# Patient Record
Sex: Male | Born: 1945 | Race: White | Hispanic: No | Marital: Married | Smoking: Former smoker
Health system: Southern US, Community
[De-identification: ages and names within clinical notes are randomized; demographics above are authoritative.]

## PROBLEM LIST (undated history)

## (undated) DIAGNOSIS — R079 Chest pain, unspecified: Secondary | ICD-10-CM

## (undated) DIAGNOSIS — K219 Gastro-esophageal reflux disease without esophagitis: Secondary | ICD-10-CM

## (undated) DIAGNOSIS — I10 Essential (primary) hypertension: Secondary | ICD-10-CM

## (undated) HISTORY — PX: KNEE SURGERY: SHX244

## (undated) HISTORY — PX: VEIN SURGERY: SHX48

## (undated) HISTORY — DX: Chest pain, unspecified: R07.9

---

## 2015-05-31 ENCOUNTER — Other Ambulatory Visit (HOSPITAL_COMMUNITY): Payer: Self-pay | Admitting: Family Medicine

## 2015-05-31 DIAGNOSIS — R911 Solitary pulmonary nodule: Secondary | ICD-10-CM

## 2015-06-13 ENCOUNTER — Ambulatory Visit (HOSPITAL_COMMUNITY)
Admission: RE | Admit: 2015-06-13 | Discharge: 2015-06-13 | Disposition: A | Discharge status: Home / Self Care | Payer: BC Managed Care – PPO | Source: Ambulatory Visit | Attending: Family Medicine | Admitting: Family Medicine

## 2015-06-13 DIAGNOSIS — R911 Solitary pulmonary nodule: Secondary | ICD-10-CM | POA: Insufficient documentation

## 2015-06-13 LAB — GLUCOSE, CAPILLARY: GLUCOSE-CAPILLARY: 98 mg/dL (ref 65–99)

## 2015-06-13 MED ORDER — FLUDEOXYGLUCOSE F - 18 (FDG) INJECTION
11.2000 | Freq: Once | INTRAVENOUS | Status: DC | PRN
  Administered 2015-06-13: 11.2 via INTRAVENOUS
  Filled 2015-06-13: qty 11.2

## 2016-04-12 ENCOUNTER — Other Ambulatory Visit (HOSPITAL_COMMUNITY): Payer: Self-pay | Admitting: Family Medicine

## 2016-04-12 DIAGNOSIS — K769 Liver disease, unspecified: Secondary | ICD-10-CM

## 2016-04-12 DIAGNOSIS — R918 Other nonspecific abnormal finding of lung field: Secondary | ICD-10-CM

## 2016-04-18 ENCOUNTER — Ambulatory Visit (HOSPITAL_COMMUNITY)
Admission: RE | Admit: 2016-04-18 | Discharge: 2016-04-18 | Disposition: A | Discharge status: Home / Self Care | Payer: Medicare Other | Source: Ambulatory Visit | Attending: Family Medicine | Admitting: Family Medicine

## 2016-04-18 ENCOUNTER — Encounter (HOSPITAL_COMMUNITY): Payer: Self-pay

## 2016-04-18 DIAGNOSIS — K769 Liver disease, unspecified: Secondary | ICD-10-CM

## 2016-04-18 DIAGNOSIS — R918 Other nonspecific abnormal finding of lung field: Secondary | ICD-10-CM

## 2016-04-18 DIAGNOSIS — K7689 Other specified diseases of liver: Secondary | ICD-10-CM | POA: Diagnosis present

## 2016-04-18 HISTORY — DX: Essential (primary) hypertension: I10

## 2016-04-18 MED ORDER — IOPAMIDOL (ISOVUE-300) INJECTION 61%
100.0000 mL | Freq: Once | INTRAVENOUS | Status: AC | PRN
  Administered 2016-04-18: 100 mL via INTRAVENOUS

## 2016-04-22 ENCOUNTER — Other Ambulatory Visit (HOSPITAL_COMMUNITY): Payer: Self-pay | Admitting: Family Medicine

## 2016-04-22 DIAGNOSIS — K7689 Other specified diseases of liver: Secondary | ICD-10-CM

## 2016-05-16 ENCOUNTER — Ambulatory Visit (HOSPITAL_COMMUNITY): Payer: Medicare Other

## 2016-06-21 ENCOUNTER — Other Ambulatory Visit (HOSPITAL_COMMUNITY): Payer: Self-pay | Admitting: Family Medicine

## 2016-06-21 DIAGNOSIS — K769 Liver disease, unspecified: Secondary | ICD-10-CM

## 2016-07-02 ENCOUNTER — Ambulatory Visit (HOSPITAL_COMMUNITY)
Admission: RE | Admit: 2016-07-02 | Discharge: 2016-07-02 | Disposition: A | Discharge status: Home / Self Care | Payer: Medicare Other | Source: Ambulatory Visit | Attending: Family Medicine | Admitting: Family Medicine

## 2016-07-02 DIAGNOSIS — I700 Atherosclerosis of aorta: Secondary | ICD-10-CM | POA: Insufficient documentation

## 2016-07-02 DIAGNOSIS — K7689 Other specified diseases of liver: Secondary | ICD-10-CM | POA: Diagnosis not present

## 2016-07-02 DIAGNOSIS — K769 Liver disease, unspecified: Secondary | ICD-10-CM | POA: Diagnosis present

## 2016-07-02 DIAGNOSIS — K760 Fatty (change of) liver, not elsewhere classified: Secondary | ICD-10-CM | POA: Insufficient documentation

## 2016-07-02 LAB — POCT I-STAT CREATININE: Creatinine, Ser: 0.9 mg/dL (ref 0.61–1.24)

## 2016-07-02 MED ORDER — GADOXETATE DISODIUM 0.25 MMOL/ML IV SOLN
10.0000 mL | Freq: Once | INTRAVENOUS | Status: AC | PRN
  Administered 2016-07-02: 9 mL via INTRAVENOUS

## 2018-05-05 ENCOUNTER — Encounter: Payer: Self-pay | Admitting: Cardiovascular Disease

## 2018-07-02 ENCOUNTER — Encounter: Payer: Self-pay | Admitting: Cardiovascular Disease

## 2018-07-22 ENCOUNTER — Telehealth: Payer: Self-pay | Admitting: Cardiovascular Disease

## 2018-07-22 NOTE — Telephone Encounter (Signed)
Received records from [place]. [facility] on 07/22/18, Appt 07/31/18 @ 8:15AM.NV

## 2018-07-24 DIAGNOSIS — R079 Chest pain, unspecified: Secondary | ICD-10-CM | POA: Insufficient documentation

## 2018-07-24 DIAGNOSIS — I10 Essential (primary) hypertension: Secondary | ICD-10-CM | POA: Insufficient documentation

## 2018-07-31 ENCOUNTER — Encounter: Payer: Self-pay | Admitting: Cardiovascular Disease

## 2018-07-31 ENCOUNTER — Ambulatory Visit: Payer: Medicare Other | Admitting: Cardiovascular Disease

## 2018-07-31 VITALS — BP 164/100 | HR 134 | Ht 73.0 in | Wt 208.2 lb

## 2018-07-31 DIAGNOSIS — I480 Paroxysmal atrial fibrillation: Secondary | ICD-10-CM | POA: Diagnosis not present

## 2018-07-31 DIAGNOSIS — I10 Essential (primary) hypertension: Secondary | ICD-10-CM | POA: Diagnosis not present

## 2018-07-31 DIAGNOSIS — I200 Unstable angina: Secondary | ICD-10-CM | POA: Diagnosis not present

## 2018-07-31 DIAGNOSIS — R0602 Shortness of breath: Secondary | ICD-10-CM | POA: Diagnosis not present

## 2018-07-31 MED ORDER — METOPROLOL SUCCINATE ER 25 MG PO TB24: 25.0000 mg | ORAL_TABLET | Freq: Every day | ORAL | 3 refills | Status: DC

## 2018-07-31 NOTE — Assessment & Plan Note (Signed)
Mr Garske is self-referred because of several month history of chest pain that occurs daily or several times a week.  His only risk factor is hypertension.  The pain lasts for minutes at a time is characterized by a pressure sensation.  Does not radiate.  Also feels weak and has no energy.  I am going to order 2D echo and a pharmacologic Myoview stress test to further evaluate.

## 2018-07-31 NOTE — Assessment & Plan Note (Signed)
History of essential hypertension on quinapril blood pressure measured today at 164/100.  I am going to begin him on Toprol-XL 25 in addition

## 2018-07-31 NOTE — Assessment & Plan Note (Signed)
Mr. Edward Vaughn was in A. fib this morning with a ventricular response of 134 and then converted spontaneously to sinus rhythm with a ventricular response of 73. This patients CHA2DS2-VASc Score and unadjusted Ischemic Stroke Rate (% per year) is equal to 2.2 % stroke rate/year from a score of 2.  I am going to add low-dose beta-blocker and begin him on oral anticoagulation.  Above score calculated as 1 point each if present [CHF, HTN, DM, Vascular=MI/PAD/Aortic Plaque, Age if 65-74, or Male] Above score calculated as 2 points each if present [Age > 75, or Stroke/TIA/TE]

## 2018-07-31 NOTE — Addendum Note (Signed)
Addended by: Theressa Stamps on: 07/31/2018 12:36 PM   Modules accepted: Orders

## 2018-07-31 NOTE — Progress Notes (Signed)
07/31/2018 Edward Vaughn   04/07/1946  [id number]  Primary Physician Park, Nettie Elm, MD Primary Cardiologist: Runell Gess MD Nicholes Calamity, [place]  HPI:  Edward Vaughn is a 72 y.o. mildly overweight married Caucasian male father of 3 biologic children and 3 stepchildren, grandfather of over 20 grandchildren who is self-referred to our practice because his daughter is seen here as well because of new onset chest pain.  His primary care provider is Dr. Meredith Mody in [place].  His only risk factor is treated hypertension.  He is never had a heart attack or stroke.  There is no family history of heart disease.  He smoked remotely.  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  He lives near [place].  He has had new onset chest pain over last several months now occurring several times a week lasting minutes at a time arthritis of chest fullness without radiation.  He does feel weak and dizzy as well.  His EKG this morning in our office was A. fib with a ventricular response of 134 although he spontaneously converted to sinus rhythm during his visit.   Current Meds  Medication Sig  . omeprazole (PRILOSEC) 20 MG capsule Take 1 capsule by mouth daily.  . quinapril (ACCUPRIL) 20 MG tablet Take 1 tablet by mouth daily.     No Known Allergies  Social History   Socioeconomic History  . Marital status: Married    Spouse name: Not on file  . Number of children: Not on file  . Years of education: Not on file  . Highest education level: Not on file  Occupational History  . Not on file  Social Needs  . Financial resource strain: Not on file  . Food insecurity:    Worry: Not on file    Inability: Not on file  . Transportation needs:    Medical: Not on file    Non-medical: Not on file  Tobacco Use  . Smoking status: Never Smoker  . Smokeless tobacco: Never Used  Substance and Sexual Activity  . Alcohol use: Not on file  . Drug use: Not on file  .  Sexual activity: Not on file  Lifestyle  . Physical activity:    Days per week: Not on file    Minutes per session: Not on file  . Stress: Not on file  Relationships  . Social connections:    Talks on phone: Not on file    Gets together: Not on file    Attends religious service: Not on file    Active member of club or organization: Not on file    Attends meetings of clubs or organizations: Not on file    Relationship status: Not on file  . Intimate partner violence:    Fear of current or ex partner: Not on file    Emotionally abused: Not on file    Physically abused: Not on file    Forced sexual activity: Not on file  Other Topics Concern  . Not on file  Social History Narrative  . Not on file     Review of Systems: General: negative for chills, fever, night sweats or weight changes.  Cardiovascular: negative for chest pain, dyspnea on exertion, edema, orthopnea, palpitations, paroxysmal nocturnal dyspnea or shortness of breath Dermatological: negative for rash Respiratory: negative for cough or wheezing Urologic: negative for hematuria Abdominal: negative for nausea, vomiting, diarrhea, bright red blood per rectum, melena, or hematemesis  Neurologic: negative for visual changes, syncope, or dizziness All other systems reviewed and are otherwise negative except as noted above.    Blood pressure (!) 164/100, pulse (!) 134, height 6\' 1"  (1.854 m), weight 208 lb 3.2 oz (94.4 kg).  General appearance: alert and no distress Neck: no adenopathy, no carotid bruit, no JVD, supple, symmetrical, trachea midline and thyroid not enlarged, symmetric, no tenderness/mass/nodules Lungs: clear to auscultation bilaterally Heart: regular rate and rhythm, S1, S2 normal, no murmur, click, rub or gallop Extremities: edema No edema Pulses: 2+ and symmetric Skin: Skin color, texture, turgor normal. No rashes or lesions Neurologic: Alert and oriented X 3, normal strength and tone. Normal symmetric  reflexes. Normal coordination and gait  EKG the initial EKG was A. fib with a ventricular response of 134 which spontaneously converted to sinus rhythm with ventricular response of 73 without ST or T wave changes.  I personally reviewed both EKGs.  ASSESSMENT AND PLAN:   Hypertension History of essential hypertension on quinapril blood pressure measured today at 164/100.  I am going to begin him on Toprol-XL 25 in addition  Paroxysmal atrial fibrillation [facility]) Mr. Judi Saa was in A. fib this morning with a ventricular response of 134 and then converted spontaneously to sinus rhythm with a ventricular response of 73. This patients CHA2DS2-VASc Score and unadjusted Ischemic Stroke Rate (% per year) is equal to 2.2 % stroke rate/year from a score of 2.  I am going to add low-dose beta-blocker and begin him on oral anticoagulation.  Above score calculated as 1 point each if present [CHF, HTN, DM, Vascular=MI/PAD/Aortic Plaque, Age if 65-74, or Male] Above score calculated as 2 points each if present [Age > 75, or Stroke/TIA/TE]   Chest pain Mr Vaughn is self-referred because of several month history of chest pain that occurs daily or several times a week.  His only risk factor is hypertension.  The pain lasts for minutes at a time is characterized by a pressure sensation.  Does not radiate.  Also feels weak and has no energy.  I am going to order 2D echo and a pharmacologic Myoview stress test to further evaluate.      Runell Gess MD FACP,FACC,FAHA, [facility] 07/31/2018 8:43 AM

## 2018-07-31 NOTE — Patient Instructions (Signed)
Medication Instructions:  Your physician has recommended you make the following change in your medication:  1) START Toprol XL 25 mg tablet by mouth ONCE daily  If you need a refill on your cardiac medications before your next appointment, please call your pharmacy.   Lab work: Your physician recommends that you return for lab work in: TODAY   If you have labs (blood work) drawn today and your tests are completely normal, you will receive your results only by: Marland Kitchen MyChart Message (if you have MyChart) OR . A paper copy in the mail If you have any lab test that is abnormal or we need to change your treatment, we will call you to review the results.  Testing/Procedures: Your physician has requested that you have an echocardiogram. Echocardiography is a painless test that uses sound waves to create images of your heart. It provides your doctor with information about the size and shape of your heart and how well your heart's chambers and valves are working. This procedure takes approximately one hour. There are no restrictions for this procedure.  Your physician has requested that you have a lexiscan myoview. For further information please visit [email or web address] Please follow instruction sheet, as given.    Follow-Up: At [facility], you and your health needs are our priority.  As part of our continuing mission to provide you with exceptional heart care, we have created designated Provider Care Teams.  These Care Teams include your primary Cardiologist (physician) and Advanced Practice Providers (APPs -  Physician Assistants and Nurse Practitioners) who all work together to provide you with the care you need, when you need it. You will need a follow up appointment in 3 weeks.  Please call our office 2 months in advance to schedule this appointment.  You may see Dr. Allyson Sabal or one of the following Advanced Practice Providers on your designated Care Team:   Corine Shelter, PA-C Judy Pimple,  [place] . Marjie Skiff, PA-C  Any Other Special Instructions Will Be Listed Below (If Applicable).

## 2018-08-04 ENCOUNTER — Telehealth (HOSPITAL_COMMUNITY): Payer: Self-pay

## 2018-08-04 NOTE — Telephone Encounter (Signed)
Encounter complete. 

## 2018-08-05 ENCOUNTER — Ambulatory Visit (HOSPITAL_COMMUNITY): Payer: Medicare Other | Attending: Cardiology

## 2018-08-05 ENCOUNTER — Other Ambulatory Visit: Payer: Medicare Other

## 2018-08-05 ENCOUNTER — Telehealth (HOSPITAL_COMMUNITY): Payer: Self-pay | Admitting: *Deleted

## 2018-08-05 ENCOUNTER — Other Ambulatory Visit: Payer: Self-pay

## 2018-08-05 DIAGNOSIS — I10 Essential (primary) hypertension: Secondary | ICD-10-CM

## 2018-08-05 DIAGNOSIS — I480 Paroxysmal atrial fibrillation: Secondary | ICD-10-CM | POA: Diagnosis not present

## 2018-08-05 DIAGNOSIS — R0602 Shortness of breath: Secondary | ICD-10-CM

## 2018-08-05 DIAGNOSIS — I200 Unstable angina: Secondary | ICD-10-CM | POA: Diagnosis present

## 2018-08-05 LAB — BASIC METABOLIC PANEL
BUN/Creatinine Ratio: 14 (ref 10–24)
BUN: 14 mg/dL (ref 8–27)
CALCIUM: 9.1 mg/dL (ref 8.6–10.2)
CHLORIDE: 103 mmol/L (ref 96–106)
CO2: 29 mmol/L (ref 20–29)
Creatinine, Ser: 0.98 mg/dL (ref 0.76–1.27)
GFR, EST AFRICAN AMERICAN: 88 mL/min/{1.73_m2} (ref >59)
GFR, EST NON AFRICAN AMERICAN: 76 mL/min/{1.73_m2} (ref >59)
Glucose: 102 mg/dL — ABNORMAL HIGH (ref 65–99)
POTASSIUM: 4.8 mmol/L (ref 3.5–5.2)
SODIUM: 139 mmol/L (ref 134–144)

## 2018-08-05 NOTE — Telephone Encounter (Signed)
Patient given detailed instructions per Myocardial Perfusion Study Information Sheet for the test on 08/06/18 at 0730. Patient notified to arrive 15 minutes early and that it is imperative to arrive on time for appointment to keep from having the test rescheduled.  If you need to cancel or reschedule your appointment, please call the office within 24 hours of your appointment. . Patient verbalized understanding.Jamayah Myszka, Adelene Idler

## 2018-08-05 NOTE — Telephone Encounter (Signed)
Left message on voicemail in reference to upcoming appointment scheduled for 08/06/18. Phone number given for a call back so details instructions can be given. Eulogia Dismore, Adelene Idler

## 2018-08-06 ENCOUNTER — Ambulatory Visit (HOSPITAL_COMMUNITY)
Admission: RE | Admit: 2018-08-06 | Discharge: 2018-08-06 | Disposition: A | Discharge status: Home / Self Care | Payer: Medicare Other | Source: Ambulatory Visit | Attending: Cardiology | Admitting: Cardiology

## 2018-08-06 ENCOUNTER — Telehealth: Payer: Self-pay | Admitting: *Deleted

## 2018-08-06 DIAGNOSIS — R0602 Shortness of breath: Secondary | ICD-10-CM | POA: Insufficient documentation

## 2018-08-06 DIAGNOSIS — I10 Essential (primary) hypertension: Secondary | ICD-10-CM | POA: Insufficient documentation

## 2018-08-06 DIAGNOSIS — I200 Unstable angina: Secondary | ICD-10-CM | POA: Insufficient documentation

## 2018-08-06 DIAGNOSIS — I480 Paroxysmal atrial fibrillation: Secondary | ICD-10-CM

## 2018-08-06 LAB — MYOCARDIAL PERFUSION IMAGING
CHL CUP RESTING HR STRESS: 61 {beats}/min
LVDIAVOL: 84 mL (ref 62–150)
LVSYSVOL: 36 mL
Peak HR: 98 {beats}/min
SDS: 0
SRS: 0
SSS: 0

## 2018-08-06 MED ORDER — APIXABAN 5 MG PO TABS: 5.0000 mg | ORAL_TABLET | Freq: Two times a day (BID) | ORAL | 1 refills | Status: DC

## 2018-08-06 MED ORDER — TECHNETIUM TC 99M TETROFOSMIN IV KIT
10.1000 | PACK | Freq: Once | INTRAVENOUS | Status: AC | PRN
  Administered 2018-08-06: 10.1 via INTRAVENOUS
  Filled 2018-08-06: qty 11

## 2018-08-06 MED ORDER — TECHNETIUM TC 99M TETROFOSMIN IV KIT
32.0000 | PACK | Freq: Once | INTRAVENOUS | Status: AC | PRN
  Administered 2018-08-06: 32 via INTRAVENOUS
  Filled 2018-08-06: qty 32

## 2018-08-06 MED ORDER — REGADENOSON 0.4 MG/5ML IV SOLN
0.4000 mg | Freq: Once | INTRAVENOUS | Status: AC
  Administered 2018-08-06: 0.4 mg via INTRAVENOUS

## 2018-08-06 NOTE — Telephone Encounter (Signed)
Received patient as a walk in requesting prescription for Eliquis.  He states he has a 30 day free card but his pharmacy needs a prescription.   Spoke to pharmacist Belenda Cruise- ok for patient to start Eliquis 5 mg BID based on updated lab work.    2 weeks of samples provided, patient has 30 day voucher, rx sent to pharmacy.

## 2018-08-07 NOTE — Telephone Encounter (Signed)
Complete

## 2018-08-21 ENCOUNTER — Encounter: Payer: Self-pay | Admitting: Cardiovascular Disease

## 2018-08-21 ENCOUNTER — Ambulatory Visit: Payer: Medicare Other | Admitting: Cardiovascular Disease

## 2018-08-21 DIAGNOSIS — I200 Unstable angina: Secondary | ICD-10-CM

## 2018-08-21 DIAGNOSIS — I10 Essential (primary) hypertension: Secondary | ICD-10-CM | POA: Diagnosis not present

## 2018-08-21 DIAGNOSIS — I480 Paroxysmal atrial fibrillation: Secondary | ICD-10-CM | POA: Diagnosis not present

## 2018-08-21 MED ORDER — QUINAPRIL HCL 10 MG PO TABS: 10.0000 mg | ORAL_TABLET | Freq: Every day | ORAL | 3 refills | Status: DC

## 2018-08-21 NOTE — Progress Notes (Signed)
08/21/2018 [name]   [birth year]  [id number]  Primary Physician Park, Nettie Elm, MD Primary Cardiologist: Runell Gess MD Nicholes Calamity, [place]  HPI:  Edward Vaughn is a 72 y.o.  mildly overweight married Caucasian male father of 3 biologic children and 3 stepchildren, grandfather of over 20 grandchildren who is self-referred to our practice because his daughter is seen here as well because of new onset chest pain.  His primary care provider is Dr. Meredith Mody in [place].    I last saw him in the office 07/31/2018.  His only risk factor is treated hypertension.  He is never had a heart attack or stroke.  There is no family history of heart disease.  He smoked remotely.  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  He lives near [place].  He has had new onset chest pain over last several months now occurring several times a week lasting minutes at a time arthritis of chest fullness without radiation.  He does feel weak and dizzy as well.  His EKG this morning in our office was A. fib with a ventricular response of 134 although he spontaneously converted to sinus rhythm during his visit.  Since I saw him last he did have a Myoview stress test performed 08/06/2018 which was low risk and nonischemic and 2D echo that showed normal LV systolic function.  He feels symptomatically improved and is had no recurrent A. fib.  He is relatively hypotensive am going to down titrate his quinapril.   Current Meds  Medication Sig  . apixaban (ELIQUIS) 5 MG TABS tablet Take 1 tablet (5 mg total) by mouth 2 (two) times daily.  . metoprolol succinate (TOPROL-XL) 25 MG 24 hr tablet Take 1 tablet (25 mg total) by mouth daily. Take with or immediately following a meal.  . omeprazole (PRILOSEC) 20 MG capsule Take 1 capsule by mouth daily.  . quinapril (ACCUPRIL) 20 MG tablet Take 1 tablet by mouth daily.     No Known Allergies  Social History   Socioeconomic History    . Marital status: Married    Spouse name: Not on file  . Number of children: Not on file  . Years of education: Not on file  . Highest education level: Not on file  Occupational History  . Not on file  Social Needs  . Financial resource strain: Not on file  . Food insecurity:    Worry: Not on file    Inability: Not on file  . Transportation needs:    Medical: Not on file    Non-medical: Not on file  Tobacco Use  . Smoking status: Never Smoker  . Smokeless tobacco: Never Used  Substance and Sexual Activity  . Alcohol use: Not on file  . Drug use: Not on file  . Sexual activity: Not on file  Lifestyle  . Physical activity:    Days per week: Not on file    Minutes per session: Not on file  . Stress: Not on file  Relationships  . Social connections:    Talks on phone: Not on file    Gets together: Not on file    Attends religious service: Not on file    Active member of club or organization: Not on file    Attends meetings of clubs or organizations: Not on file    Relationship status: Not on file  . Intimate partner violence:    Fear of current  or ex partner: Not on file    Emotionally abused: Not on file    Physically abused: Not on file    Forced sexual activity: Not on file  Other Topics Concern  . Not on file  Social History Narrative  . Not on file     Review of Systems: General: negative for chills, fever, night sweats or weight changes.  Cardiovascular: negative for chest pain, dyspnea on exertion, edema, orthopnea, palpitations, paroxysmal nocturnal dyspnea or shortness of breath Dermatological: negative for rash Respiratory: negative for cough or wheezing Urologic: negative for hematuria Abdominal: negative for nausea, vomiting, diarrhea, bright red blood per rectum, melena, or hematemesis Neurologic: negative for visual changes, syncope, or dizziness All other systems reviewed and are otherwise negative except as noted above.    Blood pressure 112/66,  pulse 72, height 6\' 1"  (1.854 m), weight 207 lb (93.9 kg).  General appearance: alert and no distress Neck: no adenopathy, no carotid bruit, no JVD, supple, symmetrical, trachea midline and thyroid not enlarged, symmetric, no tenderness/mass/nodules Lungs: clear to auscultation bilaterally Heart: regular rate and rhythm, S1, S2 normal, no murmur, click, rub or gallop Extremities: extremities normal, atraumatic, no cyanosis or edema Pulses: 2+ and symmetric Skin: Skin color, texture, turgor normal. No rashes or lesions Neurologic: Alert and oriented X 3, normal strength and tone. Normal symmetric reflexes. Normal coordination and gait  EKG not performed today  ASSESSMENT AND PLAN:   Hypertension History of essential hypertension blood pressure measured at 112/66.  He is on metoprolol and quinapril.  I am going to back down his quinapril from 20 to 10 mg because of relative hypotension and symptoms.  Chest pain History of chest pain with recent negative Myoview stress test.  Patient is no longer symptomatic.  Paroxysmal atrial fibrillation (HCC) History of paroxysmal atrial fibrillation maintaining sinus rhythm on beta-blocker and Eliquis oral anticoagulation.      Runell GessJonathan J. Prezley Qadir MD FACP,FACC,FAHA, [facility] 08/21/2018 9:55 AM

## 2018-08-21 NOTE — Addendum Note (Signed)
Addended by: Regis BillPRATT, Callen Vancuren B on: 08/21/2018 10:04 AM   Modules accepted: Orders

## 2018-08-21 NOTE — Assessment & Plan Note (Signed)
History of chest pain with recent negative Myoview stress test.  Patient is no longer symptomatic.

## 2018-08-21 NOTE — Assessment & Plan Note (Signed)
History of paroxysmal atrial fibrillation maintaining sinus rhythm on beta-blocker and Eliquis oral anticoagulation.

## 2018-08-21 NOTE — Assessment & Plan Note (Signed)
History of essential hypertension blood pressure measured at 112/66.  He is on metoprolol and quinapril.  I am going to back down his quinapril from 20 to 10 mg because of relative hypotension and symptoms.

## 2018-08-21 NOTE — Patient Instructions (Addendum)
Medication Instructions:  DECREASE YOUR QUINAPRIL TO 10 MG DAILY  If you need a refill on your cardiac medications before your next appointment, please call your pharmacy.   Lab work: REQUESTED COPY OF LABS FROM DR PARK   Testing/Procedures: NONE  Follow-Up: At [facility], you and your health needs are our priority.  As part of our continuing mission to provide you with exceptional heart care, we have created designated Provider Care Teams.  These Care Teams include your primary Cardiologist (physician) and Advanced Practice Providers (APPs -  Physician Assistants and Nurse Practitioners) who all work together to provide you with the care you need, when you need it. You will need a follow up appointment in 6 months WITH PA/NP.  Please call our office 2 months in advance to schedule this appointment.   Your physician recommends that you schedule a follow-up appointment in: 12 MONTHS WITH DR Allyson SabalBERRY. CALL THE OFFICE 2-3 MONTHS IN ADVANCE

## 2019-02-16 ENCOUNTER — Telehealth: Payer: Self-pay

## 2019-02-16 NOTE — Telephone Encounter (Signed)
Left message on both numbers in chart requesting patient contact office for verbal consent for virtual visit.

## 2019-02-18 ENCOUNTER — Other Ambulatory Visit: Payer: Self-pay | Admitting: Cardiovascular Disease

## 2019-02-18 ENCOUNTER — Other Ambulatory Visit: Payer: Self-pay

## 2019-02-18 ENCOUNTER — Telehealth: Payer: Self-pay | Admitting: Cardiology

## 2019-02-18 NOTE — Progress Notes (Signed)
Opened in error

## 2019-02-18 NOTE — Telephone Encounter (Signed)
smartphone/verbal consent/ my chart via emailed/ pre reg completed °

## 2019-02-23 ENCOUNTER — Telehealth (INDEPENDENT_AMBULATORY_CARE_PROVIDER_SITE_OTHER): Payer: Medicare Other | Admitting: General Practice

## 2019-02-23 ENCOUNTER — Encounter: Payer: Self-pay | Admitting: General Practice

## 2019-02-23 ENCOUNTER — Telehealth: Payer: Self-pay

## 2019-02-23 VITALS — Ht 73.0 in | Wt 210.0 lb

## 2019-02-23 DIAGNOSIS — I10 Essential (primary) hypertension: Secondary | ICD-10-CM | POA: Diagnosis not present

## 2019-02-23 NOTE — Telephone Encounter (Signed)
Verbal consent given to Lowella Bandy on 02/18/2019 at 3:19pm      Virtual Visit Pre-Appointment Phone Call  "Edward Vaughn, I am calling you today to discuss your upcoming appointment. We are currently trying to limit exposure to the virus that causes COVID-19 by seeing patients at home rather than in the office."  1. "What is the BEST phone number to call the day of the visit?" - include this in appointment notes  2. "Do you have or have access to (through a family member/friend) a smartphone with video capability that we can use for your visit?" a. If yes - list this number in appt notes as "cell" (if different from BEST phone #) and list the appointment type as a VIDEO visit in appointment notes b. If no - list the appointment type as a PHONE visit in appointment notes  3. Confirm consent - "In the setting of the current Covid19 crisis, you are scheduled for a VIDEO visit with your provider on 02/23/2019 at 9:00AM.  Just as we do with many in-office visits, in order for you to participate in this visit, we must obtain consent.  If you'd like, I can send this to your mychart (if signed up) or email for you to review.  Otherwise, I can obtain your verbal consent now.  All virtual visits are billed to your insurance company just like a normal visit would be.  By agreeing to a virtual visit, we'd like you to understand that the technology does not allow for your provider to perform an examination, and thus may limit your provider's ability to fully assess your condition. If your provider identifies any concerns that need to be evaluated in person, we will make arrangements to do so.  Finally, though the technology is pretty good, we cannot assure that it will always work on either your or our end, and in the setting of a video visit, we may have to convert it to a phone-only visit.  In either situation, we cannot ensure that we have a secure connection.  Are you willing to proceed?" STAFF: Did the patient  verbally acknowledge consent to telehealth visit? Document YES/NO here: YES  4. Advise patient to be prepared - "Two hours prior to your appointment, go ahead and check your blood pressure, pulse, oxygen saturation, and your weight (if you have the equipment to check those) and write them all down. When your visit starts, your provider will ask you for this information. If you have an Apple Watch or Kardia device, please plan to have heart rate information ready on the day of your appointment. Please have a pen and paper handy nearby the day of the visit as well."  5. Give patient instructions for MyChart download to smartphone OR Doximity/Doxy.me as below if video visit (depending on what platform provider is using)  6. Inform patient they will receive a phone call 15 minutes prior to their appointment time (may be from unknown caller ID) so they should be prepared to answer    TELEPHONE CALL NOTE  Edward Vaughn has been deemed a candidate for a follow-up tele-health visit to limit community exposure during the Covid-19 pandemic. I spoke with the patient via phone to ensure availability of phone/video source, confirm preferred email & phone number, and discuss instructions and expectations.  I reminded Edward Vaughn to be prepared with any vital sign and/or heart rhythm information that could potentially be obtained via home monitoring, at the time of his visit. I reminded  Edward Vaughn to expect a phone call prior to his visit.  Edward Vaughn, Edward Vaughn, CMA 02/23/2019 4:18 PM   INSTRUCTIONS FOR DOWNLOADING THE MYCHART APP TO SMARTPHONE  - The patient must first make sure to have activated MyChart and know their login information - If Apple, go to [employer] Store and type in MyChart in the search bar and download the app. If Android, ask patient to go to [employer] Play Store and type in [name] in the search bar and download the app. The app is free but as with any other app downloads, their phone may  require them to verify saved payment information or Apple/Android password.  - The patient will need to then log into the app with their MyChart username and password, and select [facility] as their healthcare provider to link the account. When it is time for your visit, go to the MyChart app, find appointments, and click Begin Video Visit. Be sure to Select Allow for your device to access the Microphone and Camera for your visit. You will then be connected, and your provider will be with you shortly.  **If they have any issues connecting, or need assistance please contact MyChart service desk [phone])**  **If using a computer, in order to ensure the best quality for their visit they will need to use either of the following Internet Browsers: [employer] Edge, or Google Chrome**  IF USING DOXIMITY or DOXY.ME - The patient will receive a link just prior to their visit by text.     FULL LENGTH CONSENT FOR TELE-HEALTH VISIT   I hereby voluntarily request, consent and authorize [facility] and its employed or contracted physicians, physician assistants, nurse practitioners or other licensed health care professionals (the Practitioner), to provide me with telemedicine health care services (the "Services") as deemed necessary by the treating Practitioner. I acknowledge and consent to receive the Services by the Practitioner via telemedicine. I understand that the telemedicine visit will involve communicating with the Practitioner through live audiovisual communication technology and the disclosure of certain medical information by electronic transmission. I acknowledge that I have been given the opportunity to request an in-person assessment or other available alternative prior to the telemedicine visit and am voluntarily participating in the telemedicine visit.  I understand that I have the right to withhold or withdraw my consent to the use of telemedicine in the course of my care at  any time, without affecting my right to future care or treatment, and that the Practitioner or I may terminate the telemedicine visit at any time. I understand that I have the right to inspect all information obtained and/or recorded in the course of the telemedicine visit and may receive copies of available information for a reasonable fee.  I understand that some of the potential risks of receiving the Services via telemedicine include:  Marland Kitchen. Delay or interruption in medical evaluation due to technological equipment failure or disruption; . Information transmitted may not be sufficient (e.g. poor resolution of images) to allow for appropriate medical decision making by the Practitioner; and/or  . In rare instances, security protocols could fail, causing a breach of personal health information.  Furthermore, I acknowledge that it is my responsibility to provide information about my medical history, conditions and care that is complete and accurate to the best of my ability. I acknowledge that Practitioner's advice, recommendations, and/or decision may be based on factors not within their control, such as incomplete or inaccurate data provided by me or distortions of diagnostic  images or specimens that may result from electronic transmissions. I understand that the practice of medicine is not an exact science and that Practitioner makes no warranties or guarantees regarding treatment outcomes. I acknowledge that I will receive a copy of this consent concurrently upon execution via email to the email address I last provided but may also request a printed copy by calling the office of [facility].    I understand that my insurance will be billed for this visit.   I have read or had this consent read to me. . I understand the contents of this consent, which adequately explains the benefits and risks of the Services being provided via telemedicine.  . I have been provided ample opportunity to ask questions  regarding this consent and the Services and have had my questions answered to my satisfaction. . I give my informed consent for the services to be provided through the use of telemedicine in my medical care  By participating in this telemedicine visit I agree to the above.

## 2019-02-23 NOTE — Patient Instructions (Signed)
Medication Instructions:  Your physician recommends that you continue on your current medications as directed. Please refer to the Current Medication list given to you today.  If you need a refill on your cardiac medications before your next appointment, please call your pharmacy.   Lab work: None  If you have labs (blood work) drawn today and your tests are completely normal, you will receive your results only by: . MyChart Message (if you have MyChart) OR . A paper copy in the mail If you have any lab test that is abnormal or we need to change your treatment, we will call you to review the results.  Testing/Procedures: None   Follow-Up: At [facility], you and your health needs are our priority.  As part of our continuing mission to provide you with exceptional heart care, we have created designated Provider Care Teams.  These Care Teams include your primary Cardiologist (physician) and Advanced Practice Providers (APPs -  Physician Assistants and Nurse Practitioners) who all work together to provide you with the care you need, when you need it. You will need a follow up appointment in 6 months.  Please call our office 2 months in advance to schedule this appointment.  You may see Jonathan Berry, MD or one of the following Advanced Practice Providers on your designated Care Team:   Luke Kilroy, PA-C Krista Kroeger, PA-C . Callie Goodrich, PA-C  Any Other Special Instructions Will Be Listed Below (If Applicable).   

## 2019-02-23 NOTE — Progress Notes (Deleted)
{  Choose 1 Note Type (Telehealth Visit or Telephone Visit):[phone]} Evaluation Performed:  Follow-up visit  This visit type was conducted due to national recommendations for restrictions regarding the COVID-19 Pandemic (e.g. social distancing).  This format is felt to be most appropriate for this patient at this time.  All issues noted in this document were discussed and addressed.  No physical exam was performed (except for noted visual exam findings with Video Visits).  Please refer to the patient's chart (MyChart message for video visits and phone note for telephone visits) for the patient's consent to telehealth for [facility]. _____________   Date:  02/23/2019   Patient ID:  Edward Vaughn, DOB [birth year], MRN [id number] Patient Location:  Primary residence  Provider location:   [facility]   Primary Care Provider:  Ardyth Man, MD Primary Cardiologist:  Nanetta Batty, MD  Chief Complaint    ***  Past Medical History    Past Medical History:  Diagnosis Date  . Chest pain   . Hypertension    No past surgical history on file.  Allergies  No Known Allergies  History of Present Illness    Edward Vaughn is a 73 y.o. male who presents via audio/video conferencing for a telehealth visit today.  ***  The patient {does/does not:200015} have symptoms concerning for COVID-19 infection (fever, chills, cough, or new shortness of breath).   Home Medications    Prior to Admission medications   Medication Sig Start Date End Date Taking? Authorizing Provider  ELIQUIS 5 MG TABS tablet TAKE ONE TABLET BY MOUTH TWICE DAILY 02/19/19   Runell Gess, MD  metoprolol succinate (TOPROL-XL) 25 MG 24 hr tablet Take 1 tablet (25 mg total) by mouth daily. Take with or immediately following a meal. 07/31/18 10/29/18  Runell Gess, MD  omeprazole (PRILOSEC) 20 MG capsule Take 1 capsule by mouth daily. 05/05/18   [provider]  quinapril (ACCUPRIL) 10 MG tablet  Take 1 tablet (10 mg total) by mouth daily. 08/21/18   Runell Gess, MD    Review of Systems    ***.  All other systems reviewed and are otherwise negative except as noted above.  Physical Exam    Vital Signs:  There were no vitals taken for this visit.   Well nourished, well developed male in no*** acute distress. ***  Accessory Clinical Findings    ECG personally reviewed by me today - *** - no acute changes.  Assessment & Plan    1.  ***  COVID-19 Education: The signs and symptoms of COVID-19 were discussed with the patient and how to seek care for testing (follow up with PCP or arrange E-visit).  ***The importance of social distancing was discussed today.  Patient Risk:   After full review of this patient's history and clinical status, I feel that he is at least moderate risk for cardiac complications at this time, thus necessitating a telehealth visit sooner than our first available in office visit.  Time:   Today, I have spent *** minutes with the patient with telehealth technology discussing ***.    Disposition:   Ronney Asters, NP 02/23/2019, 8:18 AM

## 2019-02-23 NOTE — Progress Notes (Signed)
Telephone Visit   Evaluation Performed:  Follow-up visit  This visit type was conducted due to national recommendations for restrictions regarding the COVID-19 Pandemic (e.g. social distancing).  This format is felt to be most appropriate for this patient at this time.  All issues noted in this document were discussed and addressed.  No physical exam was performed (except for noted visual exam findings with Video Visits).  Please refer to the patient's chart (MyChart message for video visits and phone note for telephone visits) for the patient's consent to telehealth for [facility]  Date:  02/23/2019   ID:  Edward Vaughn, DOB [birth year], MRN [id number]  Patient Location:  [address] [place] [address]   Provider location:   [facility] [address] [place], [place] [address]  PCP:  Ardyth Man, MD  Cardiologist:  Nanetta Batty, MD  Electrophysiologist:  None   Chief Complaint: Paroxysmal atrial fibrillation, Hypertension  History of Present Illness:    Edward Vaughn is a 73 y.o. male who presents via audio/video conferencing for a telehealth visit today.  Patient verified DOB and address.  He was seen 07/31/2018 by Dr. Allyson Sabal for chest pain that had been occurring several times a week and lasting minutes. His pain was nonradiating.  During that visit EKG showed A. fib with ventricular response of 134, patient converted to sinus during visit.  08/05/2018 echocardiogram normal.  Lexiscan 08/06/2018 no ischemia, normal LVEF 57%.  Patient was contacted today for 58-month follow-up.  He states he feels well and has been doing light yard work around his house.  He states he has been less active and gained 10 to 12 pounds due to being less active and staying at home more.  He also endorses dizziness that he has had for the past 3 years and tried medication several different times without relief.  He denies falls.  After last  visit with Dr. Allyson Sabal the patient went to his PCP.  PCP continued quinapril 20 mg daily.  Patient blood pressures stable.  He denies chest pain, shortness of breath, orthopnea space and PND.  He denies palpitations and activity intolerance.  The patient does not  have symptoms concerning for COVID-19 infection (fever, chills, cough, or new SHORTNESS OF BREATH).  Patient endorses wearing mask and keeping social distance when he needs to go out.  Prior CV studies:   The following studies were reviewed today:  Echocardiogram August 05, 2017  - Left ventricle: The cavity size was normal. There was mild focal   basal hypertrophy of the septum. Systolic function was normal.   The estimated ejection fraction was in the range of 55% to 60%.   Wall motion was normal; there were no regional wall motion   abnormalities. Left ventricular diastolic function parameters   were normal. - Aortic valve: Trileaflet; normal thickness, mildly calcified   leaflets. - Mitral valve: There was trivial regurgitation. - Tricuspid valve: There was trivial regurgitation.  Myocardial perfusion imaging: Lexiscan   The left ventricular ejection fraction is normal (55-65%).  Nuclear stress EF: 57%.  There was no ST segment deviation noted during stress.  The study is normal.   1. EF 57%, normal wall motion.  2. No ischemia or infarction on perfusion images.   Normal study.  Past Medical History:  Diagnosis Date  . Chest pain   . Hypertension    No past surgical history on file.   Current Meds  Medication  Sig  . ELIQUIS 5 MG TABS tablet TAKE ONE TABLET BY MOUTH TWICE DAILY  . omeprazole (PRILOSEC) 20 MG capsule Take 1 capsule by mouth daily.  . quinapril (ACCUPRIL) 10 MG tablet Take 1 tablet (10 mg total) by mouth daily.     Allergies:   Patient has no known allergies.   Social History   Tobacco Use  . Smoking status: Never Smoker  . Smokeless tobacco: Never Used  Substance Use Topics  .  Alcohol use: Not on file  . Drug use: Not on file     Family Hx: The patient's family history includes COPD in his father; Liver cancer in his mother.  ROS:   Review of Systems  Constitutional: Negative.   Respiratory: Negative for cough and shortness of breath.   Cardiovascular: Negative for chest pain, palpitations, orthopnea and PND.  Gastrointestinal: Negative for blood in stool, nausea and vomiting.  Genitourinary: Negative for hematuria.  Musculoskeletal: Negative for falls.  Neurological: Negative for weakness and headaches.   Please see the history of present illness.        Labs/Other Tests and Data Reviewed:    Recent Labs: 08/05/2018: BUN 14; Creatinine, Ser 0.98; Potassium 4.8; Sodium 139   Recent Lipid Panel No results found for: CHOL, TRIG, HDL, CHOLHDL, LDLCALC, LDLDIRECT  Wt Readings from Last 3 Encounters:  02/23/19 210 lb (95.3 kg)  08/21/18 207 lb (93.9 kg)  08/06/18 208 lb (94.3 kg)     Exam:    Vital Signs:  Ht 6\' 1"  (1.854 m)   Wt 210 lb (95.3 kg)   BMI 27.71 kg/m    Well nourished, well developed male in no  acute distress.   ASSESSMENT & PLAN:    1.  Paroxysmal atrial fibrillation  Continue Eliquis 5 mg, metoprolol succinate 25 mg, and quinapril 20 mg daily.  2.  Hypertension Continue metoprolol succinate 25 mg and quinapril 20 mg daily. Monitor blood pressure biweekly. Low-sodium diet and increase exercise as tolerated.  3.  Chest pain History of chest pain.  Reviewed early November nuclear stress test and echocardiogram.  Studies produced low risk results.  Patient continues to be asymptomatic.  COVID-19 Education: The signs and symptoms of COVID-19 were discussed with the patient and how to seek care for testing (follow up with PCP or arrange E-visit).  The importance of social distancing was discussed today.  Patient Risk:   After full review of this patients clinical status, I feel that they are at least moderate risk at this  time.  Time:   Today, I have spent 12:15 minutes with the patient with telehealth technology discussing symptoms of atrial fibrillation, medication, diet, exercise, weight, and the importance of monitoring blood pressure.     Medication Adjustments/Labs and Tests Ordered: Current medicines are reviewed at length with the patient today.  Concerns regarding medicines are outlined above.   Tests Ordered: No orders of the defined types were placed in this encounter.  Medication Changes: No orders of the defined types were placed in this encounter.   Disposition: Follow-up 6 months  Signed, Ronney AstersJesse M Davinity Fanara, NP  02/23/2019 8:49 AM    [facility]

## 2019-02-23 NOTE — Telephone Encounter (Signed)
Called patient to discuss AVS instructions. Patient agreeable with Jesse's recommendations and voiced understanding.   

## 2019-03-31 NOTE — Telephone Encounter (Signed)
Open n error °

## 2019-05-18 ENCOUNTER — Other Ambulatory Visit: Payer: Self-pay | Admitting: Cardiovascular Disease

## 2019-08-06 ENCOUNTER — Other Ambulatory Visit: Payer: Self-pay

## 2019-08-06 ENCOUNTER — Other Ambulatory Visit: Payer: Self-pay | Admitting: *Deleted

## 2019-08-06 ENCOUNTER — Ambulatory Visit: Payer: Medicare Other | Admitting: Cardiovascular Disease

## 2019-08-06 ENCOUNTER — Encounter: Payer: Self-pay | Admitting: Cardiovascular Disease

## 2019-08-06 DIAGNOSIS — I200 Unstable angina: Secondary | ICD-10-CM

## 2019-08-06 DIAGNOSIS — I480 Paroxysmal atrial fibrillation: Secondary | ICD-10-CM | POA: Diagnosis not present

## 2019-08-06 DIAGNOSIS — E782 Mixed hyperlipidemia: Secondary | ICD-10-CM

## 2019-08-06 DIAGNOSIS — E785 Hyperlipidemia, unspecified: Secondary | ICD-10-CM | POA: Insufficient documentation

## 2019-08-06 DIAGNOSIS — I10 Essential (primary) hypertension: Secondary | ICD-10-CM | POA: Diagnosis not present

## 2019-08-06 NOTE — Assessment & Plan Note (Signed)
History of PAF maintaining sinus rhythm on Eliquis oral anticoagulation.  He has not had recurrence.

## 2019-08-06 NOTE — Assessment & Plan Note (Signed)
History of essential hypertension with blood pressure measured today 124/78.  He is on metoprolol and quinapril.

## 2019-08-06 NOTE — Assessment & Plan Note (Signed)
Mild hyperlipidemia with lipid profile performed 06/21/2019 by his PCP revealing total cholesterol 218, LDL of 129 and HDL 40.  We will provide him with a heart healthy diet and recheck a lipid liver profile in 3 months and base recommendations on those results.

## 2019-08-06 NOTE — Patient Instructions (Signed)
Medication Instructions:  No changes *If you need a refill on your cardiac medications before your next appointment, please call your pharmacy*  Lab Work: None ordered If you have labs (blood work) drawn today and your tests are completely normal, you will receive your results only by: Marland Kitchen MyChart Message (if you have MyChart) OR . A paper copy in the mail If you have any lab test that is abnormal or we need to change your treatment, we will call you to review the results.  Testing/Procedures: None ordered  Follow-Up: At [facility], you and your health needs are our priority.  As part of our continuing mission to provide you with exceptional heart care, we have created designated Provider Care Teams.  These Care Teams include your primary Cardiologist (physician) and Advanced Practice Providers (APPs -  Physician Assistants and Nurse Practitioners) who all work together to provide you with the care you need, when you need it.  Your next appointment:   12 months  The format for your next appointment:   In Person  Provider:   Dr. Gwenlyn Found

## 2019-08-06 NOTE — Progress Notes (Addendum)
08/06/2019 [name]   [birth year]  [id number]  Primary Physician Park, Nettie Elm, MD Primary Cardiologist: Runell Gess MD Nicholes Calamity, [place]  HPI:  Edward Vaughn is a 73 y.o.    mildly overweight married Caucasian male father of 3 biologic children and 3 stepchildren, grandfather of over 20 grandchildren who is self-referred to our practice because his daughter is seen here as well because of new onset chest pain. His primary care provider is Dr. Meredith Mody in [place].   I last saw him in the office 08/21/2018.  His only risk factor is treated hypertension. He is never had a heart attack or stroke. There is no family history of heart disease. He smoked remotely. 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 He lives near [place]. He has had new onset chest pain over last several months now occurring several times a week lasting minutes at a time arthritis of chest fullness without radiation. He does feel weak and dizzy as well. His EKG this morning in our office was A. fib with a ventricular response of 134 although he spontaneously converted to sinus rhythm during his visit.  He  did have a Myoview stress test performed 08/06/2018 which was low risk and nonischemic and 2D echo that showed normal LV systolic function.    Since I saw him a year ago he has seen Edd Fabian, NP in the office 02/23/2019 and he has remained asymptomatic.  He denies chest pain or shortness of breath.  He has not had recurrent A. fib clinically.  Current Meds  Medication Sig  . ELIQUIS 5 MG TABS tablet TAKE ONE TABLET BY MOUTH TWICE DAILY  . metoprolol succinate (TOPROL-XL) 25 MG 24 hr tablet TAKE 1 TABLET DAILY WITH FOOD  . omeprazole (PRILOSEC) 20 MG capsule Take 1 capsule by mouth daily.  . quinapril (ACCUPRIL) 20 MG tablet Take 20 mg by mouth at bedtime.  . [DISCONTINUED] quinapril (ACCUPRIL) 10 MG tablet Take 1 tablet (10 mg total) by mouth daily.  (Patient taking differently: Take 20 mg by mouth daily. )     No Known Allergies  Social History   Socioeconomic History  . Marital status: Married    Spouse name: Not on file  . Number of children: Not on file  . Years of education: Not on file  . Highest education level: Not on file  Occupational History  . Not on file  Social Needs  . Financial resource strain: Not on file  . Food insecurity    Worry: Not on file    Inability: Not on file  . Transportation needs    Medical: Not on file    Non-medical: Not on file  Tobacco Use  . Smoking status: Never Smoker  . Smokeless tobacco: Never Used  Substance and Sexual Activity  . Alcohol use: Not on file  . Drug use: Not on file  . Sexual activity: Not on file  Lifestyle  . Physical activity    Days per week: Not on file    Minutes per session: Not on file  . Stress: Not on file  Relationships  . Social Musician on phone: Not on file    Gets together: Not on file    Attends religious service: Not on file    Active member of club or organization: Not on file    Attends meetings of clubs or organizations: Not on file  Relationship status: Not on file  . Intimate partner violence    Fear of current or ex partner: Not on file    Emotionally abused: Not on file    Physically abused: Not on file    Forced sexual activity: Not on file  Other Topics Concern  . Not on file  Social History Narrative  . Not on file     Review of Systems: General: negative for chills, fever, night sweats or weight changes.  Cardiovascular: negative for chest pain, dyspnea on exertion, edema, orthopnea, palpitations, paroxysmal nocturnal dyspnea or shortness of breath Dermatological: negative for rash Respiratory: negative for cough or wheezing Urologic: negative for hematuria Abdominal: negative for nausea, vomiting, diarrhea, bright red blood per rectum, melena, or hematemesis Neurologic: negative for visual changes,  syncope, or dizziness All other systems reviewed and are otherwise negative except as noted above.    Blood pressure 124/78, pulse 69, temperature 98.1 F (36.7 C), height 6\' 1"  (1.854 m), weight 208 lb (94.3 kg).  General appearance: alert and no distress Neck: no adenopathy, no carotid bruit, no JVD, supple, symmetrical, trachea midline and thyroid not enlarged, symmetric, no tenderness/mass/nodules Lungs: clear to auscultation bilaterally Heart: regular rate and rhythm, S1, S2 normal, no murmur, click, rub or gallop Extremities: extremities normal, atraumatic, no cyanosis or edema Pulses: 2+ and symmetric Skin: Skin color, texture, turgor normal. No rashes or lesions Neurologic: Alert and oriented X 3, normal strength and tone. Normal symmetric reflexes. Normal coordination and gait  EKG sinus rhythm at 69 without ST or T wave changes.  I personally reviewed this EKG  ASSESSMENT AND PLAN:   Hypertension History of essential hypertension with blood pressure measured today 124/78.  He is on metoprolol and quinapril.  Chest pain History of atypical chest pain with negative Myoview stress test 08/06/2018 without recurrence.  Paroxysmal atrial fibrillation (HCC) History of PAF maintaining sinus rhythm on Eliquis oral anticoagulation.  He has not had recurrence.  Hyperlipidemia Mild hyperlipidemia with lipid profile performed 06/21/2019 by his PCP revealing total cholesterol 218, LDL of 129 and HDL 40.  We will provide him with a heart healthy diet and recheck a lipid liver profile in 3 months and base recommendations on those results.      Lorretta Harp MD FACP,FACC,FAHA, [facility] 08/06/2019 11:06 AM

## 2019-08-06 NOTE — Assessment & Plan Note (Signed)
History of atypical chest pain with negative Myoview stress test 08/06/2018 without recurrence.

## 2019-08-10 ENCOUNTER — Other Ambulatory Visit (INDEPENDENT_AMBULATORY_CARE_PROVIDER_SITE_OTHER): Payer: Medicare Other

## 2019-08-10 DIAGNOSIS — I200 Unstable angina: Secondary | ICD-10-CM

## 2019-08-10 DIAGNOSIS — I10 Essential (primary) hypertension: Secondary | ICD-10-CM | POA: Diagnosis not present

## 2019-08-10 DIAGNOSIS — I480 Paroxysmal atrial fibrillation: Secondary | ICD-10-CM

## 2019-08-10 DIAGNOSIS — R0602 Shortness of breath: Secondary | ICD-10-CM

## 2019-08-10 DIAGNOSIS — I259 Chronic ischemic heart disease, unspecified: Secondary | ICD-10-CM

## 2019-08-10 DIAGNOSIS — E782 Mixed hyperlipidemia: Secondary | ICD-10-CM | POA: Diagnosis not present

## 2019-09-27 ENCOUNTER — Other Ambulatory Visit: Payer: Self-pay | Admitting: Cardiovascular Disease

## 2020-06-02 ENCOUNTER — Other Ambulatory Visit: Payer: Self-pay | Admitting: General Practice

## 2020-08-18 ENCOUNTER — Other Ambulatory Visit: Payer: Self-pay

## 2020-08-18 ENCOUNTER — Ambulatory Visit: Payer: Medicare PPO | Admitting: Cardiovascular Disease

## 2020-08-18 ENCOUNTER — Encounter: Payer: Self-pay | Admitting: Cardiovascular Disease

## 2020-08-18 DIAGNOSIS — E782 Mixed hyperlipidemia: Secondary | ICD-10-CM | POA: Diagnosis not present

## 2020-08-18 DIAGNOSIS — I259 Chronic ischemic heart disease, unspecified: Secondary | ICD-10-CM

## 2020-08-18 DIAGNOSIS — I480 Paroxysmal atrial fibrillation: Secondary | ICD-10-CM | POA: Diagnosis not present

## 2020-08-18 DIAGNOSIS — I10 Essential (primary) hypertension: Secondary | ICD-10-CM | POA: Diagnosis not present

## 2020-08-18 NOTE — Assessment & Plan Note (Signed)
History of essential hypertension a blood pressure measured today 114/68.  He is on metoprolol and quinapril.

## 2020-08-18 NOTE — Progress Notes (Signed)
08/18/2020 [name]   May 05, 1946  [id number]  Primary Physician Park, Nettie Elm, MD Primary Cardiologist: Runell Gess MD Nicholes Calamity, [place]  HPI:  Edward Vaughn is a 74 y.o.  mildly overweight married Caucasian male father of 3 biologic children and 3 stepchildren, grandfather of over 20 grandchildren who is self-referred to our practice because his daughter is seen here as well because of new onset chest pain. His primary care provider is Dr. Meredith Mody in [place].I last saw him in the office  08/06/2019. His only risk factor is treated hypertension. He is never had a heart attack or stroke. There is no family history of heart disease. He smoked remotely. 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 He [place]. He has had new onset chest pain over last several months now occurring several times a week lasting minutes at a time arthritis of chest fullness without radiation. He does feel weak and dizzy as well. His EKG this morning in our office was A. fib with a ventricular response of 134 although he spontaneously converted to sinus rhythm during his visit.  He  did have a Myoview stress test performed 08/06/2018 which was low risk and nonischemic and 2D echo that showed normal LV systolic function.   Since I saw him a year ago he he has not had any recurrent chest pain or episodes of PAF.  He is completely asymptomatic.  Current Meds  Medication Sig  . metoprolol succinate (TOPROL-XL) 25 MG 24 hr tablet TAKE 1 TABLET DAILY WITH FOOD  . omeprazole (PRILOSEC) 20 MG capsule Take 1 capsule by mouth daily.  . quinapril (ACCUPRIL) 20 MG tablet Take 20 mg by mouth at bedtime.     No Known Allergies  Social History   Socioeconomic History  . Marital status: Married    Spouse name: Not on file  . Number of children: Not on file  . Years of education: Not on file  . Highest education level: Not on file  Occupational  History  . Not on file  Tobacco Use  . Smoking status: Never Smoker  . Smokeless tobacco: Never Used  Substance and Sexual Activity  . Alcohol use: Not on file  . Drug use: Not on file  . Sexual activity: Not on file  Other Topics Concern  . Not on file  Social History Narrative  . Not on file   Social Determinants of Health   Financial Resource Strain:   . Difficulty of Paying Living Expenses: Not on file  Food Insecurity:   . Worried About Programme researcher, broadcasting/film/video in the Last Year: Not on file  . Ran Out of Food in the Last Year: Not on file  Transportation Needs:   . Lack of Transportation (Medical): Not on file  . Lack of Transportation (Non-Medical): Not on file  Physical Activity:   . Days of Exercise per Week: Not on file  . Minutes of Exercise per Session: Not on file  Stress:   . Feeling of Stress : Not on file  Social Connections:   . Frequency of Communication with Friends and Family: Not on file  . Frequency of Social Gatherings with Friends and Family: Not on file  . Attends Religious Services: Not on file  . Active Member of Clubs or Organizations: Not on file  . Attends Banker Meetings: Not on file  . Marital Status: Not on file  Intimate  Partner Violence:   . Fear of Current or Ex-Partner: Not on file  . Emotionally Abused: Not on file  . Physically Abused: Not on file  . Sexually Abused: Not on file     Review of Systems: General: negative for chills, fever, night sweats or weight changes.  Cardiovascular: negative for chest pain, dyspnea on exertion, edema, orthopnea, palpitations, paroxysmal nocturnal dyspnea or shortness of breath Dermatological: negative for rash Respiratory: negative for cough or wheezing Urologic: negative for hematuria Abdominal: negative for nausea, vomiting, diarrhea, bright red blood per rectum, melena, or hematemesis Neurologic: negative for visual changes, syncope, or dizziness All other systems reviewed and are  otherwise negative except as noted above.    Blood pressure 114/68, pulse 62, height 6\' 1"  (1.854 m), weight 203 lb (92.1 kg).  General appearance: alert and no distress Neck: no adenopathy, no carotid bruit, no JVD, supple, symmetrical, trachea midline and thyroid not enlarged, symmetric, no tenderness/mass/nodules Lungs: clear to auscultation bilaterally Heart: regular rate and rhythm, S1, S2 normal, no murmur, click, rub or gallop Extremities: extremities normal, atraumatic, no cyanosis or edema Pulses: 2+ and symmetric Skin: Skin color, texture, turgor normal. No rashes or lesions Neurologic: Alert and oriented X 3, normal strength and tone. Normal symmetric reflexes. Normal coordination and gait  EKG sinus rhythm at 62 without ST or T wave changes.  I personally reviewed this EKG.  ASSESSMENT AND PLAN:   Hypertension History of essential hypertension a blood pressure measured today 114/68.  He is on metoprolol and quinapril.  Chest pain History of atypical chest pain with negative Myoview performed 08/06/2018 without recurrent symptoms.  Paroxysmal atrial fibrillation (HCC) History of brief PAF in the past without recurrence currently not on oral anticoagulation  Hyperlipidemia Early onsetHistory of hyperlipidemia not on therapy being managed by      13/03/2018 MD [facility], [facility] 08/18/2020 10:08 AM

## 2020-08-18 NOTE — Assessment & Plan Note (Signed)
History of atypical chest pain with negative Myoview performed 08/06/2018 without recurrent symptoms.

## 2020-08-18 NOTE — Assessment & Plan Note (Signed)
Early onsetHistory of hyperlipidemia not on therapy being managed by

## 2020-08-18 NOTE — Assessment & Plan Note (Signed)
History of brief PAF in the past without recurrence currently not on oral anticoagulation

## 2020-08-18 NOTE — Patient Instructions (Signed)
Medication Instructions:  No changes *If you need a refill on your cardiac medications before your next appointment, please call your pharmacy*   Lab Work: None ordered If you have labs (blood work) drawn today and your tests are completely normal, you will receive your results only by: . MyChart Message (if you have MyChart) OR . A paper copy in the mail If you have any lab test that is abnormal or we need to change your treatment, we will call you to review the results.   Testing/Procedures: None ordered   Follow-Up: At [facility], you and your health needs are our priority.  As part of our continuing mission to provide you with exceptional heart care, we have created designated Provider Care Teams.  These Care Teams include your primary Cardiologist (physician) and Advanced Practice Providers (APPs -  Physician Assistants and Nurse Practitioners) who all work together to provide you with the care you need, when you need it.  We recommend signing up for the patient portal called "MyChart".  Sign up information is provided on this After Visit Summary.  MyChart is used to connect with patients for Virtual Visits (Telemedicine).  Patients are able to view lab/test results, encounter notes, upcoming appointments, etc.  Non-urgent messages can be sent to your provider as well.   To learn more about what you can do with MyChart, go to [email or web address].    Your next appointment:   12 month(s)  The format for your next appointment:   In Person  Provider:   You may see Jonathan Berry, MD or one of the following Advanced Practice Providers on your designated Care Team:    Luke Kilroy, PA-C  Callie Goodrich, PA-C  Jesse Cleaver, FNP    

## 2020-09-05 ENCOUNTER — Other Ambulatory Visit: Payer: Self-pay | Admitting: General Practice

## 2021-05-08 ENCOUNTER — Other Ambulatory Visit: Payer: Self-pay

## 2021-05-08 ENCOUNTER — Ambulatory Visit (INDEPENDENT_AMBULATORY_CARE_PROVIDER_SITE_OTHER): Payer: Medicare PPO | Admitting: Otolaryngology

## 2021-05-08 DIAGNOSIS — J339 Nasal polyp, unspecified: Secondary | ICD-10-CM | POA: Diagnosis not present

## 2021-05-08 DIAGNOSIS — R42 Dizziness and giddiness: Secondary | ICD-10-CM | POA: Diagnosis not present

## 2021-05-08 DIAGNOSIS — H6123 Impacted cerumen, bilateral: Secondary | ICD-10-CM

## 2021-05-08 DIAGNOSIS — H903 Sensorineural hearing loss, bilateral: Secondary | ICD-10-CM | POA: Diagnosis not present

## 2021-05-08 NOTE — Progress Notes (Signed)
HPI: Edward Vaughn is a 75 y.o. male who presents is referred by Dr. Kate Sable at [facility] family practice  for evaluation of headache, decreased hearing and dizziness.  He has noticed headaches more on the right side and feels like he feels a knot over the right eyebrow.  He is also had chronic dizziness for several years.  Does not really describe vertigo.  He is also noted hearing loss over several years much worse on the right side.  He has bilateral hearing aids.  He has been treated with Flonase and is using that presently. He is referred to ENT for evaluation of his sinuses as well as progressive right ear hearing loss.  He has had previous hearing test and I do not have the hearing test to review these.  But apparently has a sensorineural hearing loss in both ears but worse on the right side.. He has a little more stuffiness on the right side of his nose than the left.  He also occasionally gets some bloody mucus from the back of the nose when he sucks back. He also complains of some popping sound in the right ear when he opens and closes his jaw.  Past Medical History:  Diagnosis Date   Chest pain    Hypertension    No past surgical history on file. Social History   Socioeconomic History   Marital status: Married    Spouse name: Not on file   Number of children: Not on file   Years of education: Not on file   Highest education level: Not on file  Occupational History   Not on file  Tobacco Use   Smoking status: Never   Smokeless tobacco: Never  Substance and Sexual Activity   Alcohol use: Not on file   Drug use: Not on file   Sexual activity: Not on file  Other Topics Concern   Not on file  Social History Narrative   Not on file   Social Determinants of Health   Financial Resource Strain: Not on file  Food Insecurity: Not on file  Transportation Needs: Not on file  Physical Activity: Not on file  Stress: Not on file  Social Connections: Not on file   Family  History  Problem Relation Age of Onset   Liver cancer Mother    COPD Father    No Known Allergies Prior to Admission medications   Medication Sig Start Date End Date Taking? Authorizing Provider  metoprolol succinate (TOPROL-XL) 25 MG 24 hr tablet TAKE 1 TABLET DAILY WITH FOOD 09/05/20   Runell Gess, MD  omeprazole (PRILOSEC) 20 MG capsule Take 1 capsule by mouth daily. 05/05/18   [provider]  quinapril (ACCUPRIL) 20 MG tablet Take 20 mg by mouth at bedtime.    [provider]     Positive ROS: Otherwise negative  All other systems have been reviewed and were otherwise negative with the exception of those mentioned in the HPI and as above.  Physical Exam: Constitutional: Alert, well-appearing, no acute distress Ears: External ears without lesions or tenderness. Ear canals with wax buildup on both sides much worse on the right side.  This was cleaned with suction and curettes.  He had minimal wax on the left side that was cleaned with a curette.  Both TMs were clear with no middle ear space abnormality noted no middle ear effusion noted.  On tuning fork testing AC was greater than BC bilaterally.  He had severe bilateral sensorineural hearing  loss with gross checking with the tuning forks. Nasal: External nose without lesions. Septum with minimal deformity.  After decongesting the nose nasal endoscopy was performed and on nasal endoscopy the left middle meatus region was clear.  However he had some polypoid tissue within the right middle meatus anteriorly as well as posteriorly.  The nasopharynx was clear and no obstruction of the eustachian tube region was noted..  Oral: Lips and gums without lesions. Tongue and palate mucosa without lesions. Posterior oropharynx clear. Neck: No palpable adenopathy or masses Respiratory: Breathing comfortably  Skin: No facial/neck lesions or rash noted.  Cerumen impaction removal  Date/Time: 05/08/2021 5:47 PM Performed by:  Drema Halon, MD Authorized by: Drema Halon, MD   Consent:    Consent obtained:  Verbal   Consent given by:  Patient   Risks discussed:  Pain and bleeding Procedure details:    Location:  L ear and R ear   Procedure type: curette and suction   Post-procedure details:    Inspection:  TM intact and canal normal   Hearing quality:  Improved   Procedure completion:  Tolerated well, no immediate complications Comments:     Right ear canal had a fair amount of wax buildup that was partially obstructing the hearing aid and was probably contributing to the popping sound when he open and closed his jaw.  TMs were clear bilaterally.  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 Cerumen buildup more in the right ear canal is probably causing the popping sound he is hearing. Dizziness questionable etiology.  No clinical evidence of BPPV or vertigo.  Plan: We will plan on scheduling a CT scan of the sinuses to better evaluate the sinonasal polyps on the right side and his frontal sinuses which he is complaining about.  Recommended continued use of the Flonase 2 sprays each nostril at night. Severe bilateral sensorineural hearing loss worse on the right side although I do not have any audiologic testing to review. Dizziness questionable etiology.  I would refer patient to neurology concerning further evaluation and treatment of his dizziness as well as possible treatment of his headaches pending results of the CT scan of the sinuses.   Narda Bonds, MD   CC:

## 2021-05-09 ENCOUNTER — Other Ambulatory Visit (INDEPENDENT_AMBULATORY_CARE_PROVIDER_SITE_OTHER): Payer: Self-pay | Admitting: Otolaryngology

## 2021-05-09 DIAGNOSIS — J339 Nasal polyp, unspecified: Secondary | ICD-10-CM

## 2021-05-31 ENCOUNTER — Ambulatory Visit
Admission: RE | Admit: 2021-05-31 | Discharge: 2021-05-31 | Disposition: A | Discharge status: Home / Self Care | Payer: Medicare PPO | Source: Ambulatory Visit | Attending: Otolaryngology | Admitting: Otolaryngology

## 2021-05-31 DIAGNOSIS — J339 Nasal polyp, unspecified: Secondary | ICD-10-CM

## 2021-06-04 ENCOUNTER — Other Ambulatory Visit: Payer: Self-pay

## 2021-06-04 ENCOUNTER — Ambulatory Visit (INDEPENDENT_AMBULATORY_CARE_PROVIDER_SITE_OTHER): Payer: Medicare PPO | Admitting: Otolaryngology

## 2021-06-04 DIAGNOSIS — J339 Nasal polyp, unspecified: Secondary | ICD-10-CM | POA: Diagnosis not present

## 2021-06-04 DIAGNOSIS — J320 Chronic maxillary sinusitis: Secondary | ICD-10-CM

## 2021-06-04 DIAGNOSIS — J341 Cyst and mucocele of nose and nasal sinus: Secondary | ICD-10-CM | POA: Diagnosis not present

## 2021-06-04 NOTE — Progress Notes (Signed)
HPI: Edward Vaughn is a 75 y.o. male who returns today for evaluation of right intranasal polyp noted on recent exam.  He presents today with his wife to review the recent CT scan of the sinuses.  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  Most likely consistent with antrochoanal polyp versus possible fungal disease within the right maxillary sinus versus neoplasm.  Patient does have more nasal congestion on the right side compared to the left and uses decongestant spray intermittently. He has had occasional postnasal bloody discharge. He takes medication for blood pressure and occasional baby aspirin but not on any blood thinners. Denies any cardiac disease.Marland Kitchen  Past Medical History:  Diagnosis Date   Chest pain    Hypertension    No past surgical history on file. Social History   Socioeconomic History   Marital status: Married    Spouse name: Not on file   Number of children: Not on file   Years of education: Not on file   Highest education level: Not on file  Occupational History   Not on file  Tobacco Use   Smoking status: Never   Smokeless tobacco: Never  Substance and Sexual Activity   Alcohol use: Not on file   Drug use: Not on file   Sexual activity: Not on file  Other Topics Concern   Not on file  Social History Narrative   Not on file   Social Determinants of Health   Financial Resource Strain: Not on file  Food Insecurity: Not on file  Transportation Needs: Not on file  Physical Activity: Not on file  Stress: Not on file  Social Connections: Not on file   Family History  Problem Relation Age of Onset   Liver cancer Mother    COPD Father    No Known Allergies Prior to Admission medications   Medication Sig Start Date End Date Taking? Authorizing Provider  metoprolol  succinate (TOPROL-XL) 25 MG 24 hr tablet TAKE 1 TABLET DAILY WITH FOOD 09/05/20   Runell Gess, MD  omeprazole (PRILOSEC) 20 MG capsule Take 1 capsule by mouth daily. 05/05/18   [provider]  quinapril (ACCUPRIL) 20 MG tablet Take 20 mg by mouth at bedtime.    [provider]     Positive ROS: Otherwise negative  All other systems have been reviewed and were otherwise negative with the exception of those mentioned in the HPI and as above.  Physical Exam: Constitutional: Alert, well-appearing, no acute distress Ears: External ears without lesions or tenderness. Ear canals are clear bilaterally with intact, clear TMs.  Nasal: External nose without lesions. Septum relatively midline.  Right intranasal polypoid mass posteriorly.  No obvious mucopurulent discharge..   Oral: Lips and gums without lesions. Tongue and palate mucosa without lesions. Posterior oropharynx clear. Neck: No palpable adenopathy or masses Lungs: Clear to auscultation Respiratory: Breathing comfortably Cardiac exam: Regular rate and rhythm without murmur Skin: No facial/neck lesions or rash noted.  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 Intermittent nasal obstruction.  Plan: Reviewed with the patient as well as his wife concerning the CT scan today and reviewed this with him.  He will require surgery in order to fix this and reviewed the surgery with him which would consist of FESS with right anterior ethmoidectomy and right maxillary ostia enlargement with removal polypoid disease.  We will also perform bilateral inferior turbinate reductions to help improve his nasal breathing. He would like to have this performed ASAP and we will plan on trying to schedule this within the next 2 weeks.   Narda Bonds, MD

## 2021-06-07 ENCOUNTER — Other Ambulatory Visit: Payer: Self-pay

## 2021-06-07 ENCOUNTER — Encounter (HOSPITAL_BASED_OUTPATIENT_CLINIC_OR_DEPARTMENT_OTHER): Payer: Self-pay | Admitting: Otolaryngology

## 2021-06-08 ENCOUNTER — Ambulatory Visit (INDEPENDENT_AMBULATORY_CARE_PROVIDER_SITE_OTHER): Payer: Self-pay | Admitting: Otolaryngology

## 2021-06-08 DIAGNOSIS — J3489 Other specified disorders of nose and nasal sinuses: Secondary | ICD-10-CM

## 2021-06-08 NOTE — H&P (Signed)
PREOPERATIVE H&P  Chief Complaint: Chronic nasal obstruction  HPI: Edward Vaughn is a 75 y.o. male who presents for evaluation of chronic nasal obstruction with polypoid mass within the right nasal cavity on nasal endoscopy.  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  He is taken the operating room at this time for right endoscopic sinus surgery with right anterior ethmoidectomy and right maxillary ostia enlargement with removal of disease along with bilateral inferior turbinate reductions.  Past Medical History:  Diagnosis Date   Chest pain    GERD (gastroesophageal reflux disease)    Hypertension    Past Surgical History:  Procedure Laterality Date   KNEE SURGERY     VEIN SURGERY     Social History   Socioeconomic History   Marital status: Married    Spouse name: Not on file   Number of children: Not on file   Years of education: Not on file   Highest education level: Not on file  Occupational History   Not on file  Tobacco Use   Smoking status: Former    Types: Cigarettes   Smokeless tobacco: Never  Substance and Sexual Activity   Alcohol use: Not on file   Drug use: Not on file   Sexual activity: Not on file  Other Topics Concern   Not on file  Social History Narrative   Not on file   Social Determinants of Health   Financial Resource Strain: Not on file  Food Insecurity: Not on file  Transportation Needs: Not on file  Physical Activity: Not on file  Stress: Not on file  Social Connections: Not on file   Family History  Problem Relation Age of Onset   Liver cancer Mother    COPD Father    No Known Allergies Prior to Admission medications   Medication Sig Start Date End Date Taking? Authorizing Provider  metoprolol succinate (TOPROL-XL) 25 MG 24 hr tablet TAKE 1 TABLET DAILY WITH FOOD 09/05/20   Berry, Jonathan  J, MD  omeprazole (PRILOSEC) 20 MG capsule Take 1 capsule by mouth daily. 05/05/18   [provider]  quinapril (ACCUPRIL) 20 MG tablet Take 20 mg by mouth at bedtime.    [provider]     Positive ROS: Otherwise negative  All other systems have been reviewed and were otherwise negative with the exception of those mentioned in the HPI and as above.  Physical Exam: There were no vitals filed for this visit.  General: Alert, no acute distress Oral: Normal oral mucosa and tonsils Nasal: Mild septal deformity with moderate rhinitis and large inferior turbinates.  Polypoid mass extending from the right middle meatus to the nasopharynx. Neck: No palpable adenopathy or thyroid nodules Ear: Ear canal is clear with normal appearing TMs Cardiovascular: Regular rate and rhythm, no murmur.  Respiratory: Clear to auscultation Neurologic: Alert and oriented x 3   Assessment/Plan: Nasal obstruction secondary to chronic rhinitis and right polypoid mass with expansion of the right maxillary sinus on CT scan consistent with antrochoanal polyp versus right maxillary sinus fungal disease.  Also has turbinate hypertrophy.  Plan for functional endoscopic sinus surgery with right anterior ethmoidectomy and right maxillary ostia enlargement with removal polypoid disease.  Bilateral inferior turbinate reductions.   Verble Styron, MD 06/08/2021 3:45 PM   

## 2021-06-08 NOTE — H&P (View-Only) (Signed)
PREOPERATIVE H&P  Chief Complaint: Chronic nasal obstruction  HPI: Edward Vaughn is a 75 y.o. male who presents for evaluation of chronic nasal obstruction with polypoid mass within the right nasal cavity on nasal endoscopy.  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  He is taken the operating room at this time for right endoscopic sinus surgery with right anterior ethmoidectomy and right maxillary ostia enlargement with removal of disease along with bilateral inferior turbinate reductions.  Past Medical History:  Diagnosis Date   Chest pain    GERD (gastroesophageal reflux disease)    Hypertension    Past Surgical History:  Procedure Laterality Date   KNEE SURGERY     VEIN SURGERY     Social History   Socioeconomic History   Marital status: Married    Spouse name: Not on file   Number of children: Not on file   Years of education: Not on file   Highest education level: Not on file  Occupational History   Not on file  Tobacco Use   Smoking status: Former    Types: Cigarettes   Smokeless tobacco: Never  Substance and Sexual Activity   Alcohol use: Not on file   Drug use: Not on file   Sexual activity: Not on file  Other Topics Concern   Not on file  Social History Narrative   Not on file   Social Determinants of Health   Financial Resource Strain: Not on file  Food Insecurity: Not on file  Transportation Needs: Not on file  Physical Activity: Not on file  Stress: Not on file  Social Connections: Not on file   Family History  Problem Relation Age of Onset   Liver cancer Mother    COPD Father    No Known Allergies Prior to Admission medications   Medication Sig Start Date End Date Taking? Authorizing Provider  metoprolol succinate (TOPROL-XL) 25 MG 24 hr tablet TAKE 1 TABLET DAILY WITH FOOD 09/05/20   Runell Gess, MD  omeprazole (PRILOSEC) 20 MG capsule Take 1 capsule by mouth daily. 05/05/18   [provider]  quinapril (ACCUPRIL) 20 MG tablet Take 20 mg by mouth at bedtime.    [provider]     Positive ROS: Otherwise negative  All other systems have been reviewed and were otherwise negative with the exception of those mentioned in the HPI and as above.  Physical Exam: There were no vitals filed for this visit.  General: Alert, no acute distress Oral: Normal oral mucosa and tonsils Nasal: Mild septal deformity with moderate rhinitis and large inferior turbinates.  Polypoid mass extending from the right middle meatus to the nasopharynx. Neck: No palpable adenopathy or thyroid nodules Ear: Ear canal is clear with normal appearing TMs Cardiovascular: Regular rate and rhythm, no murmur.  Respiratory: Clear to auscultation Neurologic: Alert and oriented x 3   Assessment/Plan: Nasal obstruction secondary to chronic rhinitis and right polypoid mass with expansion of the right maxillary sinus on CT scan consistent with antrochoanal polyp versus right maxillary sinus fungal disease.  Also has turbinate hypertrophy.  Plan for functional endoscopic sinus surgery with right anterior ethmoidectomy and right maxillary ostia enlargement with removal polypoid disease.  Bilateral inferior turbinate reductions.   Dillard Cannon, MD 06/08/2021 3:45 PM

## 2021-06-14 NOTE — Anesthesia Preprocedure Evaluation (Addendum)
Anesthesia Evaluation  Patient identified by MRN, date of birth, ID band Patient awake    Reviewed: Allergy & Precautions, NPO status , Patient's Chart, lab work & pertinent test results, reviewed documented beta blocker date and time   History of Anesthesia Complications Negative for: history of anesthetic complications  Airway Mallampati: I  TM Distance: >3 FB Neck ROM: Full    Dental  (+) Dental Advisory Given, Teeth Intact   Pulmonary former smoker,    Pulmonary exam normal        Cardiovascular hypertension, Pt. on medications and Pt. on home beta blockers Normal cardiovascular exam+ dysrhythmias (in past) Atrial Fibrillation    '19 Lexiscan - Nuclear stress EF: 57%. here was no ST segment deviation noted during stress. The study is normal.     Neuro/Psych negative neurological ROS  negative psych ROS   GI/Hepatic Neg liver ROS, GERD  Medicated and Controlled,  Endo/Other  negative endocrine ROS  Renal/GU negative Renal ROS     Musculoskeletal negative musculoskeletal ROS (+)   Abdominal   Peds  Hematology negative hematology ROS (+)   Anesthesia Other Findings   Reproductive/Obstetrics                           Anesthesia Physical Anesthesia Plan  ASA: 2  Anesthesia Plan: General   Post-op Pain Management:    Induction: Intravenous  PONV Risk Score and Plan: 2 and Treatment may vary due to age or medical condition, Ondansetron and Dexamethasone  Airway Management Planned: Oral ETT  Additional Equipment: None  Intra-op Plan:   Post-operative Plan: Extubation in OR  Informed Consent: I have reviewed the patients History and Physical, chart, labs and discussed the procedure including the risks, benefits and alternatives for the proposed anesthesia with the patient or authorized representative who has indicated his/her understanding and acceptance.     Dental advisory  given  Plan Discussed with: CRNA and Anesthesiologist  Anesthesia Plan Comments:       Anesthesia Quick Evaluation

## 2021-06-15 ENCOUNTER — Other Ambulatory Visit: Payer: Self-pay

## 2021-06-15 ENCOUNTER — Encounter (HOSPITAL_BASED_OUTPATIENT_CLINIC_OR_DEPARTMENT_OTHER): Payer: Self-pay | Admitting: Otolaryngology

## 2021-06-15 ENCOUNTER — Ambulatory Visit (HOSPITAL_BASED_OUTPATIENT_CLINIC_OR_DEPARTMENT_OTHER): Payer: Medicare PPO | Admitting: Anesthesiology

## 2021-06-15 ENCOUNTER — Encounter (HOSPITAL_BASED_OUTPATIENT_CLINIC_OR_DEPARTMENT_OTHER)
Admission: RE | Disposition: A | Discharge status: Home / Self Care | Payer: Self-pay | Source: Ambulatory Visit | Attending: Otolaryngology

## 2021-06-15 ENCOUNTER — Ambulatory Visit (HOSPITAL_BASED_OUTPATIENT_CLINIC_OR_DEPARTMENT_OTHER)
Admission: RE | Admit: 2021-06-15 | Discharge: 2021-06-15 | Disposition: A | Discharge status: Home / Self Care | Payer: Medicare PPO | Source: Ambulatory Visit | Attending: Otolaryngology | Admitting: Otolaryngology

## 2021-06-15 DIAGNOSIS — J320 Chronic maxillary sinusitis: Secondary | ICD-10-CM

## 2021-06-15 DIAGNOSIS — J329 Chronic sinusitis, unspecified: Secondary | ICD-10-CM | POA: Insufficient documentation

## 2021-06-15 DIAGNOSIS — Z87891 Personal history of nicotine dependence: Secondary | ICD-10-CM | POA: Insufficient documentation

## 2021-06-15 DIAGNOSIS — Z825 Family history of asthma and other chronic lower respiratory diseases: Secondary | ICD-10-CM | POA: Diagnosis not present

## 2021-06-15 DIAGNOSIS — Z79899 Other long term (current) drug therapy: Secondary | ICD-10-CM | POA: Diagnosis not present

## 2021-06-15 DIAGNOSIS — J343 Hypertrophy of nasal turbinates: Secondary | ICD-10-CM

## 2021-06-15 DIAGNOSIS — J3489 Other specified disorders of nose and nasal sinuses: Secondary | ICD-10-CM

## 2021-06-15 DIAGNOSIS — J322 Chronic ethmoidal sinusitis: Secondary | ICD-10-CM

## 2021-06-15 HISTORY — PX: ETHMOIDECTOMY: SHX5197

## 2021-06-15 HISTORY — PX: TURBINATE REDUCTION: SHX6157

## 2021-06-15 HISTORY — PX: NASAL SINUS SURGERY: SHX719

## 2021-06-15 HISTORY — DX: Gastro-esophageal reflux disease without esophagitis: K21.9

## 2021-06-15 SURGERY — SINUS SURGERY, ENDOSCOPIC
Anesthesia: General | Site: Nose | Laterality: Right

## 2021-06-15 MED ORDER — FENTANYL CITRATE (PF) 100 MCG/2ML IJ SOLN
INTRAMUSCULAR | Status: AC
  Filled 2021-06-15: qty 2

## 2021-06-15 MED ORDER — FENTANYL CITRATE (PF) 100 MCG/2ML IJ SOLN: 25.0000 ug | INTRAMUSCULAR | Status: DC | PRN

## 2021-06-15 MED ORDER — CEPHALEXIN 500 MG PO CAPS: 500.0000 mg | ORAL_CAPSULE | Freq: Two times a day (BID) | ORAL | 0 refills | Status: AC

## 2021-06-15 MED ORDER — LIDOCAINE-EPINEPHRINE 1 %-1:100000 IJ SOLN
INTRAMUSCULAR | Status: AC
  Filled 2021-06-15: qty 1

## 2021-06-15 MED ORDER — MIDAZOLAM HCL 2 MG/2ML IJ SOLN
INTRAMUSCULAR | Status: AC
  Filled 2021-06-15: qty 2

## 2021-06-15 MED ORDER — SUCCINYLCHOLINE CHLORIDE 200 MG/10ML IV SOSY
PREFILLED_SYRINGE | INTRAVENOUS | Status: AC
  Filled 2021-06-15: qty 10

## 2021-06-15 MED ORDER — OXYMETAZOLINE HCL 0.05 % NA SOLN
NASAL | Status: DC | PRN
  Administered 2021-06-15: 1 via TOPICAL

## 2021-06-15 MED ORDER — CHLORHEXIDINE GLUCONATE CLOTH 2 % EX PADS: 6.0000 | MEDICATED_PAD | Freq: Once | CUTANEOUS | Status: DC

## 2021-06-15 MED ORDER — OXYMETAZOLINE HCL 0.05 % NA SOLN
NASAL | Status: AC
  Filled 2021-06-15: qty 30

## 2021-06-15 MED ORDER — ROCURONIUM BROMIDE 100 MG/10ML IV SOLN
INTRAVENOUS | Status: DC | PRN
  Administered 2021-06-15: 50 mg via INTRAVENOUS

## 2021-06-15 MED ORDER — ROCURONIUM BROMIDE 10 MG/ML (PF) SYRINGE
PREFILLED_SYRINGE | INTRAVENOUS | Status: AC
  Filled 2021-06-15: qty 10

## 2021-06-15 MED ORDER — PROPOFOL 10 MG/ML IV BOLUS
INTRAVENOUS | Status: DC | PRN
  Administered 2021-06-15: 130 mg via INTRAVENOUS

## 2021-06-15 MED ORDER — ONDANSETRON HCL 4 MG/2ML IJ SOLN
4.0000 mg | Freq: Once | INTRAMUSCULAR | Status: AC | PRN
  Administered 2021-06-15: 4 mg via INTRAVENOUS

## 2021-06-15 MED ORDER — CEFAZOLIN SODIUM-DEXTROSE 2-4 GM/100ML-% IV SOLN
INTRAVENOUS | Status: AC
  Filled 2021-06-15: qty 100

## 2021-06-15 MED ORDER — LIDOCAINE-EPINEPHRINE 1 %-1:100000 IJ SOLN
INTRAMUSCULAR | Status: DC | PRN
  Administered 2021-06-15: 10 mL
  Administered 2021-06-15: 5 mL

## 2021-06-15 MED ORDER — SODIUM CHLORIDE 0.9 % IV SOLN
INTRAVENOUS | Status: AC | PRN
  Administered 2021-06-15: 50 mL

## 2021-06-15 MED ORDER — LIDOCAINE 2% (20 MG/ML) 5 ML SYRINGE
INTRAMUSCULAR | Status: AC
  Filled 2021-06-15: qty 5

## 2021-06-15 MED ORDER — ONDANSETRON HCL 4 MG/2ML IJ SOLN
INTRAMUSCULAR | Status: AC
  Filled 2021-06-15: qty 2

## 2021-06-15 MED ORDER — ATROPINE SULFATE 0.4 MG/ML IJ SOLN
INTRAMUSCULAR | Status: AC
  Filled 2021-06-15: qty 1

## 2021-06-15 MED ORDER — CEFAZOLIN SODIUM-DEXTROSE 2-4 GM/100ML-% IV SOLN
2.0000 g | INTRAVENOUS | Status: AC
  Administered 2021-06-15: 2 g via INTRAVENOUS

## 2021-06-15 MED ORDER — LIDOCAINE HCL (CARDIAC) PF 100 MG/5ML IV SOSY
PREFILLED_SYRINGE | INTRAVENOUS | Status: DC | PRN
  Administered 2021-06-15: 60 mg via INTRAVENOUS

## 2021-06-15 MED ORDER — SUGAMMADEX SODIUM 200 MG/2ML IV SOLN
INTRAVENOUS | Status: DC | PRN
  Administered 2021-06-15: 200 mg via INTRAVENOUS

## 2021-06-15 MED ORDER — CIPROFLOXACIN-DEXAMETHASONE 0.3-0.1 % OT SUSP
OTIC | Status: AC
  Filled 2021-06-15: qty 7.5

## 2021-06-15 MED ORDER — FENTANYL CITRATE (PF) 100 MCG/2ML IJ SOLN
INTRAMUSCULAR | Status: DC | PRN
  Administered 2021-06-15: 50 ug via INTRAVENOUS
  Administered 2021-06-15: 100 ug via INTRAVENOUS

## 2021-06-15 MED ORDER — EPHEDRINE SULFATE 50 MG/ML IJ SOLN
INTRAMUSCULAR | Status: DC | PRN
  Administered 2021-06-15: 10 mg via INTRAVENOUS

## 2021-06-15 MED ORDER — EPHEDRINE 5 MG/ML INJ
INTRAVENOUS | Status: AC
  Filled 2021-06-15: qty 5

## 2021-06-15 MED ORDER — DEXAMETHASONE SODIUM PHOSPHATE 10 MG/ML IJ SOLN
INTRAMUSCULAR | Status: AC
  Filled 2021-06-15: qty 1

## 2021-06-15 MED ORDER — OXYCODONE HCL 5 MG/5ML PO SOLN
5.0000 mg | Freq: Once | ORAL | Status: DC | PRN
Start: 2021-06-15 — End: 2021-06-15

## 2021-06-15 MED ORDER — DEXAMETHASONE SODIUM PHOSPHATE 4 MG/ML IJ SOLN
INTRAMUSCULAR | Status: DC | PRN
  Administered 2021-06-15: 10 mg via INTRAVENOUS

## 2021-06-15 MED ORDER — PHENYLEPHRINE 40 MCG/ML (10ML) SYRINGE FOR IV PUSH (FOR BLOOD PRESSURE SUPPORT)
PREFILLED_SYRINGE | INTRAVENOUS | Status: AC
  Filled 2021-06-15: qty 10

## 2021-06-15 MED ORDER — MUPIROCIN 2 % EX OINT
TOPICAL_OINTMENT | CUTANEOUS | Status: AC
  Filled 2021-06-15: qty 22

## 2021-06-15 MED ORDER — OXYCODONE HCL 5 MG PO TABS: 5.0000 mg | ORAL_TABLET | Freq: Once | ORAL | Status: DC | PRN

## 2021-06-15 MED ORDER — LACTATED RINGERS IV SOLN: INTRAVENOUS | Status: DC

## 2021-06-15 MED ORDER — MUPIROCIN 2 % EX OINT
TOPICAL_OINTMENT | CUTANEOUS | Status: DC | PRN
  Administered 2021-06-15: 1 via TOPICAL

## 2021-06-15 SURGICAL SUPPLY — 49 items
ATTRACTOMAT 16X20 MAGNETIC DRP (DRAPES) IMPLANT
BLADE INF TURB ROT M4 2 5PK (BLADE) ×3 IMPLANT
BLADE RAD40 ROTATE 4M 4 5PK (BLADE) IMPLANT
BLADE RAD60 ROTATE M4 4 5PK (BLADE) IMPLANT
BLADE SURG 15 STRL LF DISP TIS (BLADE) IMPLANT
BLADE SURG 15 STRL SS (BLADE)
BLADE TRICUT ROTATE M4 4 5PK (BLADE) ×3 IMPLANT
BUR HS RAD FRONTAL 3 (BURR) IMPLANT
CANISTER SUC SOCK COL 7IN (MISCELLANEOUS) ×3 IMPLANT
CANISTER SUCT 1200ML W/VALVE (MISCELLANEOUS) ×3 IMPLANT
COAGULATOR SUCT 8FR VV (MISCELLANEOUS) ×3 IMPLANT
DECANTER SPIKE VIAL GLASS SM (MISCELLANEOUS) IMPLANT
DRESSING NASAL KENNEDY 3.5X.9 (MISCELLANEOUS) IMPLANT
DRSG NASAL KENNEDY 3.5X.9 (MISCELLANEOUS)
DRSG NASAL KENNEDY LMNT 8CM (GAUZE/BANDAGES/DRESSINGS) IMPLANT
DRSG NASOPORE 8CM (GAUZE/BANDAGES/DRESSINGS) ×3 IMPLANT
DRSG TELFA 3X8 NADH (GAUZE/BANDAGES/DRESSINGS) IMPLANT
ELECT REM PT RETURN 9FT ADLT (ELECTROSURGICAL) ×3
ELECTRODE REM PT RTRN 9FT ADLT (ELECTROSURGICAL) ×2 IMPLANT
GLOVE SURG MICRO LTX SZ7.5 (GLOVE) ×3 IMPLANT
GLOVE SURG POLYISO LF SZ6.5 (GLOVE) ×6 IMPLANT
GLOVE SURG UNDER POLY LF SZ6.5 (GLOVE) ×3 IMPLANT
GLOVE SURG UNDER POLY LF SZ7 (GLOVE) ×6 IMPLANT
GOWN STRL REUS W/ TWL LRG LVL3 (GOWN DISPOSABLE) ×4 IMPLANT
GOWN STRL REUS W/ TWL XL LVL3 (GOWN DISPOSABLE) ×2 IMPLANT
GOWN STRL REUS W/TWL LRG LVL3 (GOWN DISPOSABLE) ×6
GOWN STRL REUS W/TWL XL LVL3 (GOWN DISPOSABLE) ×3
HEMOSTAT SURGICEL .5X2 ABSORB (HEMOSTASIS) IMPLANT
HEMOSTAT SURGICEL 2X14 (HEMOSTASIS) IMPLANT
IV NS 500ML (IV SOLUTION) ×3
IV NS 500ML BAXH (IV SOLUTION) ×2 IMPLANT
NEEDLE PRECISIONGLIDE 27X1.5 (NEEDLE) ×3 IMPLANT
NEEDLE SPNL 25GX3.5 QUINCKE BL (NEEDLE) ×3 IMPLANT
NS IRRIG 1000ML POUR BTL (IV SOLUTION) ×3 IMPLANT
PACK BASIN DAY SURGERY FS (CUSTOM PROCEDURE TRAY) ×3 IMPLANT
PACK ENT DAY SURGERY (CUSTOM PROCEDURE TRAY) ×3 IMPLANT
PATTIES SURGICAL .5 X3 (DISPOSABLE) ×3 IMPLANT
SLEEVE SCD COMPRESS KNEE MED (STOCKING) ×3 IMPLANT
SOL ANTI FOG 6CC (MISCELLANEOUS) ×2 IMPLANT
SOLUTION ANTI FOG 6CC (MISCELLANEOUS) ×1
SPONGE GAUZE 2X2 8PLY STRL LF (GAUZE/BANDAGES/DRESSINGS) ×3 IMPLANT
SUT CHROMIC 4 0 PS 2 18 (SUTURE) IMPLANT
SUT ETHILON 3 0 PS 1 (SUTURE) IMPLANT
SUT SILK 2 0 PERMA HAND 18 BK (SUTURE) IMPLANT
SYR 3ML 18GX1 1/2 (SYRINGE) IMPLANT
TOWEL GREEN STERILE FF (TOWEL DISPOSABLE) ×6 IMPLANT
TRAY DSU PREP LF (CUSTOM PROCEDURE TRAY) ×3 IMPLANT
TUBE CONNECTING 20X1/4 (TUBING) ×3 IMPLANT
YANKAUER SUCT BULB TIP NO VENT (SUCTIONS) ×3 IMPLANT

## 2021-06-15 NOTE — Brief Op Note (Signed)
06/15/2021  10:12 AM  PATIENT:  Edward Vaughn  75 y.o. male  PRE-OPERATIVE DIAGNOSIS:  CHRONIC SINUSITIS WITH NASAL OBSTRUCTION  POST-OPERATIVE DIAGNOSIS:  CHRONIC SINUSITIS WITH NASAL OBSTRUCTION  PROCEDURE:  Procedure(s): FUNCTIONAL ENDOSCOPIC SINUS SURGERY AND RIGHT MAXILLARY OSTEO ENLARGEMENT (Right) RIGHT ANTERIOR ETHMOIDECTOMY (Right) BILATERAL INFERIOR TURBINATE REDUCTION (Bilateral)  SURGEON:  Surgeon(s) and Role:    Drema Halon, MD - Primary  PHYSICIAN ASSISTANT:   ASSISTANTS: none   ANESTHESIA:   general  EBL:  50 mL   BLOOD ADMINISTERED:none  DRAINS: none   LOCAL MEDICATIONS USED:  XYLOCAINE   SPECIMEN:  Source of Specimen:  Right sinus contents  DISPOSITION OF SPECIMEN:  PATHOLOGY  COUNTS:  YES  TOURNIQUET:  * No tourniquets in log *  DICTATION: .Other Dictation: Dictation Number [id number]  PLAN OF CARE: Discharge to home after PACU  PATIENT DISPOSITION:  PACU - hemodynamically stable.   Delay start of Pharmacological VTE agent (>24hrs) due to surgical blood loss or risk of bleeding: yes

## 2021-06-15 NOTE — Interval H&P Note (Signed)
History and Physical Interval Note:  06/15/2021 8:09 AM  Edward Vaughn  has presented today for surgery, with the diagnosis of CHRONIC SINUSITIS.  The various methods of treatment have been discussed with the patient and family. After consideration of risks, benefits and other options for treatment, the patient has consented to  Procedure(s): FUNCTIONAL ENDOSCOPIC SINUS SURGERY AND RIGHT MAXILLARY OSTEO ENLARGEMENT (N/A) RIGHT ANTERIOR ETHMOIDECTOMY (Right) TURBINATE REDUCTION (N/A) as a surgical intervention.  The patient's history has been reviewed, patient examined, no change in status, stable for surgery.  I have reviewed the patient's chart and labs.  Questions were answered to the patient's satisfaction.     Dillard Cannon

## 2021-06-15 NOTE — Transfer of Care (Signed)
Immediate Anesthesia Transfer of Care Note  Patient: LEVERN PITTER  Procedure(s) Performed: FUNCTIONAL ENDOSCOPIC SINUS SURGERY AND RIGHT MAXILLARY OSTEO ENLARGEMENT (Right: Nose) RIGHT ANTERIOR ETHMOIDECTOMY (Right: Nose) BILATERAL INFERIOR TURBINATE REDUCTION (Bilateral: Nose)  Patient Location: PACU  Anesthesia Type:General  Level of Consciousness: awake, alert , oriented, drowsy and patient cooperative  Airway & Oxygen Therapy: Patient Spontanous Breathing and Patient connected to face mask oxygen  Post-op Assessment: Report given to RN and Post -op Vital signs reviewed and stable  Post vital signs: Reviewed and stable  Last Vitals:  Vitals Value Taken Time  BP 157/78 06/15/21 1024  Temp    Pulse 78 06/15/21 1024  Resp    SpO2 98 % 06/15/21 1024  Vitals shown include unvalidated device data.  Last Pain:  Vitals:   06/15/21 0716  TempSrc: Oral  PainSc: 0-No pain      Patients Stated Pain Goal: 3 (06/15/21 0716)  Complications: No notable events documented.

## 2021-06-15 NOTE — Op Note (Signed)
NAME: Edward Vaughn, Edward Vaughn MEDICAL RECORD NO: [id number] ACCOUNT NO: [id number] DATE OF BIRTH: 08-Feb-1946 FACILITY: [facility] LOCATION: [facility] PHYSICIAN: Kristine Garbe. 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  OPERATIONS PERFORMED:  Functional endoscopic sinus surgery with right maxillary ostia enlargement with removal of polypoid disease and right anterior ethmoidectomy.  Bilateral inferior turbinate reductions with the Medtronic turbinate blade.  SURGEON:  Kristine Garbe. Ezzard Standing, MD  ANESTHESIA:  General endotracheal.  ESTIMATED BLOOD LOSS:  50 mL.  COMPLICATIONS:  None.  BRIEF CLINICAL NOTE:  The patient is a 75 year old gentleman who has had chronic nasal obstruction, and on CT scan of his sinuses, he has completely opacified right maxillary sinus with enlargement of the sinus as he appears to have had chronic changes  with thickened bony membranes that had extended into the right sinus cavity.  On exam, he has polypoid disease extruding from the sinus extending back into the nasopharynx area.  He has partial opacification of the right anterior ethmoid area.  The  remaining sinuses are clear.  He is taken to the operating room at this time for right functional endoscopic sinus surgery with right maxillary ostia enlargement and removal of polypoid disease and right anterior ethmoidectomy along with bilateral  inferior turbinate reductions.  DESCRIPTION OF PROCEDURE:  After adequate endotracheal anesthesia, the nose was prepped with Betadine solution, draped off sterile towels.  The nose was then further prepped with cotton pledgets soaked in Afrin, and the right middle meatus area was  injected  with 8 mL of Xylocaine with epinephrine.  The inferior turbinates were injected with 1-2 mL of Xylocaine with epinephrine along with saline.  First, the right sinus was explored with the endoscope.  The patient had some polypoid disease  extruding from the sinus extending back posteriorly.  The uncinate process and the medial maxillary sinus wall had bowed medially adjacent to the middle turbinate.  A sickle knife was used to make an incision through the infundibulum area, and bone was  removed with straight cup forceps.  Polypoid disease was encountered and was removed and sent as specimen to pathology.  In addition, the patient had a large amount of thick mucoid fluid within the right maxillary sinus that was also aspirated with  curved suction.  The patient had polypoid changes of the maxillary mucosa throughout.  Some of the polypoid disease ended up into the anterior ethmoid area, and this was removed with up Thru-Cut and forceps.  Microdebrider was used to open up the  anterior ethmoid area and the nasal frontal area.  It was also used to remove some of the mucosa and bony expansion of the medial wall of the maxillary sinus.  The maxillary sinus ostia was enlarged to approximately 1.5 cm.  The right maxillary sinus was  irrigated out with saline.  Of note, the patient had some thick bone posteriorly where the maxillary sinus had bowed medially, and this was left intact as the posterior ethmoid area was clear on the CT scan.  This completed the endoscopic procedure on  the right side.  Nasal endoscopy on the left side.  The middle meatus was clear with no polypoid disease noted.  The nasopharynx was clear.  Next, inferior turbinate reductions were performed with Medtronic turbinate blade after injecting  Xylocaine with  epinephrine and saline in both inferior turbinates.  Hemostasis was obtained with suction cautery.  This completed the inferior turbinate reductions.  Completion suction cautery was used  to obtain hemostasis, and NasoPore packing was placed in the right  anterior ethmoid, right maxillary ostia enlargement region for hemostasis.  This completed the procedure.  The patient was awoken from anesthesia and transferred to recovery room postop doing well.  DISPOSITION:  The patient was discharged home later this morning on Keflex 500 mg b.i.d. for the next 10 days, Tylenol and ibuprofen p.r.n. pain.  He will follow up in my office in 4 days for recheck and cleaning the nose.  He is instructed on using  saline rinses a couple of times a day starting tomorrow.   [facility] D: 06/15/2021 10:22:12 am T: 06/15/2021 5:51:00 pm  JOB: [id number]/ [id number]

## 2021-06-15 NOTE — Discharge Instructions (Addendum)
Take your regular medications and tylenol or ibuprofen prn pain, Start Keflex 500 mg twice per day tonight Starting tomorrow use saline nasal rinse 2 or 3 times per day to clean nose. Ok to blow nose gently Elevate head of bed and apply cool compress to nose for the next 12 hrs to reduce bleeding and swelling Return to see Dr Ezzard Standing next Tuesday at 4:30 Call office if you have any problems or questions  [phone]    Post Anesthesia Home Care Instructions  Activity: Get plenty of rest for the remainder of the day. A responsible individual must stay with you for 24 hours following the procedure.  For the next 24 hours, DO NOT: -Drive a car -Advertising copywriter -Drink alcoholic beverages -Take any medication unless instructed by your physician -Make any legal decisions or sign important papers.  Meals: Start with liquid foods such as gelatin or soup. Progress to regular foods as tolerated. Avoid greasy, spicy, heavy foods. If nausea and/or vomiting occur, drink only clear liquids until the nausea and/or vomiting subsides. Call your physician if vomiting continues.  Special Instructions/Symptoms: Your throat may feel dry or sore from the anesthesia or the breathing tube placed in your throat during surgery. If this causes discomfort, gargle with warm salt water. The discomfort should disappear within 24 hours.  If you had a scopolamine patch placed behind your ear for the management of post- operative nausea and/or vomiting:  1. The medication in the patch is effective for 72 hours, after which it should be removed.  Wrap patch in a tissue and discard in the trash. Wash hands thoroughly with soap and water. 2. You may remove the patch earlier than 72 hours if you experience unpleasant side effects which may include dry mouth, dizziness or visual disturbances. 3. Avoid touching the patch. Wash your hands with soap and water after contact with the patch.

## 2021-06-15 NOTE — Anesthesia Procedure Notes (Signed)
Procedure Name: Intubation Date/Time: 06/15/2021 8:32 AM Performed by: Willa Frater, CRNA Pre-anesthesia Checklist: Patient identified, Emergency Drugs available, Suction available and Patient being monitored Patient Re-evaluated:Patient Re-evaluated prior to induction Oxygen Delivery Method: Circle system utilized Preoxygenation: Pre-oxygenation with 100% oxygen Induction Type: IV induction Ventilation: Mask ventilation without difficulty Laryngoscope Size: Mac and 3 Grade View: Grade II Tube type: Oral Tube size: 7.0 mm Number of attempts: 1 Airway Equipment and Method: Stylet and Oral airway Placement Confirmation: ETT inserted through vocal cords under direct vision, positive ETCO2 and breath sounds checked- equal and bilateral Secured at: 23 cm Tube secured with: Tape Dental Injury: Teeth and Oropharynx as per pre-operative assessment

## 2021-06-18 ENCOUNTER — Encounter (HOSPITAL_BASED_OUTPATIENT_CLINIC_OR_DEPARTMENT_OTHER): Payer: Self-pay | Admitting: Otolaryngology

## 2021-06-18 LAB — SURGICAL PATHOLOGY

## 2021-06-19 ENCOUNTER — Encounter (INDEPENDENT_AMBULATORY_CARE_PROVIDER_SITE_OTHER): Payer: Medicare PPO | Admitting: Otolaryngology

## 2021-06-19 NOTE — Anesthesia Postprocedure Evaluation (Signed)
Anesthesia Post Note  Patient: Edward Vaughn  Procedure(s) Performed: FUNCTIONAL ENDOSCOPIC SINUS SURGERY AND RIGHT MAXILLARY OSTEO ENLARGEMENT (Right: Nose) RIGHT ANTERIOR ETHMOIDECTOMY (Right: Nose) BILATERAL INFERIOR TURBINATE REDUCTION (Bilateral: Nose)     Patient location during evaluation: PACU Anesthesia Type: General Level of consciousness: awake and alert Pain management: pain level controlled Vital Signs Assessment: post-procedure vital signs reviewed and stable Respiratory status: spontaneous breathing, nonlabored ventilation and respiratory function stable Cardiovascular status: stable and blood pressure returned to baseline Anesthetic complications: no   No notable events documented.  Last Vitals:  Vitals:   06/15/21 1045 06/15/21 1103  BP: (!) 164/87 (!) 153/92  Pulse: 74 74  Resp: 16 16  Temp:  36.6 C  SpO2: 97% 97%    Last Pain:  Vitals:   06/18/21 1002  TempSrc:   PainSc: 2                  Beryle Lathe

## 2021-06-25 ENCOUNTER — Ambulatory Visit (INDEPENDENT_AMBULATORY_CARE_PROVIDER_SITE_OTHER): Payer: Medicare PPO | Admitting: Otolaryngology

## 2021-06-25 ENCOUNTER — Other Ambulatory Visit: Payer: Self-pay

## 2021-06-25 DIAGNOSIS — Z4889 Encounter for other specified surgical aftercare: Secondary | ICD-10-CM

## 2021-06-25 NOTE — Progress Notes (Signed)
HPI: Edward Vaughn is a 75 y.o. male who presents 10 days s/p right FESS for right maxillary sinus obstruction as well as inferior turbinate reductions.  He is doing well.  Still getting some scabbing from his nose. Reviewed final pathology report with the patient and his wife today which showed just chronic inflammation..   Past Medical History:  Diagnosis Date   Chest pain    GERD (gastroesophageal reflux disease)    Hypertension    Past Surgical History:  Procedure Laterality Date   ETHMOIDECTOMY Right 06/15/2021   Procedure: RIGHT ANTERIOR ETHMOIDECTOMY;  Surgeon: Drema Halon, MD;  Location: [facility];  Service: ENT;  Laterality: Right;   KNEE SURGERY     NASAL SINUS SURGERY Right 06/15/2021   Procedure: FUNCTIONAL ENDOSCOPIC SINUS SURGERY AND RIGHT MAXILLARY OSTEO ENLARGEMENT;  Surgeon: Drema Halon, MD;  Location: [facility];  Service: ENT;  Laterality: Right;   TURBINATE REDUCTION Bilateral 06/15/2021   Procedure: BILATERAL INFERIOR TURBINATE REDUCTION;  Surgeon: Drema Halon, MD;  Location: [facility];  Service: ENT;  Laterality: Bilateral;   VEIN SURGERY     Social History   Socioeconomic History   Marital status: Married    Spouse name: Not on file   Number of children: Not on file   Years of education: Not on file   Highest education level: Not on file  Occupational History   Not on file  Tobacco Use   Smoking status: Former    Types: Cigarettes   Smokeless tobacco: Never  Substance and Sexual Activity   Alcohol use: Not on file   Drug use: Not on file   Sexual activity: Not on file  Other Topics Concern   Not on file  Social History Narrative   Not on file   Social Determinants of Health   Financial Resource Strain: Not on file  Food Insecurity: Not on file  Transportation Needs: Not on file  Physical Activity: Not on file  Stress: Not on file  Social Connections: Not on file    Family History  Problem Relation Age of Onset   Liver cancer Mother    COPD Father    No Known Allergies Prior to Admission medications   Medication Sig Start Date End Date Taking? Authorizing Provider  cephALEXin (KEFLEX) 500 MG capsule Take 1 capsule (500 mg total) by mouth 2 (two) times daily. 1 Tab twice per day for the next 10 days. Start tonight 06/15/21   Drema Halon, MD  metoprolol succinate (TOPROL-XL) 25 MG 24 hr tablet TAKE 1 TABLET DAILY WITH FOOD 09/05/20   Runell Gess, MD  omeprazole (PRILOSEC) 20 MG capsule Take 1 capsule by mouth daily. 05/05/18   [provider]  quinapril (ACCUPRIL) 20 MG tablet Take 20 mg by mouth at bedtime.    [provider]     Physical Exam: He has mild scabbing crusting along the inferior turbinates.  No mucopurulent discharge noted.   Assessment: S/p right FESS and bilateral inferior turbinate reductions.  Doing well.  Plan: He will continue with the saline rinses and follow-up in 2 weeks for recheck.   Narda Bonds, MD

## 2021-07-11 ENCOUNTER — Encounter (INDEPENDENT_AMBULATORY_CARE_PROVIDER_SITE_OTHER): Payer: Medicare PPO | Admitting: Otolaryngology

## 2021-07-18 ENCOUNTER — Ambulatory Visit (INDEPENDENT_AMBULATORY_CARE_PROVIDER_SITE_OTHER): Payer: Medicare PPO | Admitting: Otolaryngology

## 2021-07-18 ENCOUNTER — Other Ambulatory Visit: Payer: Self-pay

## 2021-07-18 DIAGNOSIS — Z4889 Encounter for other specified surgical aftercare: Secondary | ICD-10-CM

## 2021-07-18 NOTE — Progress Notes (Signed)
HPI: Edward Vaughn is a 75 y.o. male who presents 1 month days s/p turbinate reductions and right FESS for right maxillary sinus obstruction.  He is doing well and breathing better..   Past Medical History:  Diagnosis Date   Chest pain    GERD (gastroesophageal reflux disease)    Hypertension    Past Surgical History:  Procedure Laterality Date   ETHMOIDECTOMY Right 06/15/2021   Procedure: RIGHT ANTERIOR ETHMOIDECTOMY;  Surgeon: Drema Halon, MD;  Location: [facility];  Service: ENT;  Laterality: Right;   KNEE SURGERY     NASAL SINUS SURGERY Right 06/15/2021   Procedure: FUNCTIONAL ENDOSCOPIC SINUS SURGERY AND RIGHT MAXILLARY OSTEO ENLARGEMENT;  Surgeon: Drema Halon, MD;  Location: [facility];  Service: ENT;  Laterality: Right;   TURBINATE REDUCTION Bilateral 06/15/2021   Procedure: BILATERAL INFERIOR TURBINATE REDUCTION;  Surgeon: Drema Halon, MD;  Location: [facility];  Service: ENT;  Laterality: Bilateral;   VEIN SURGERY     Social History   Socioeconomic History   Marital status: Married    Spouse name: Not on file   Number of children: Not on file   Years of education: Not on file   Highest education level: Not on file  Occupational History   Not on file  Tobacco Use   Smoking status: Former    Types: Cigarettes   Smokeless tobacco: Never  Substance and Sexual Activity   Alcohol use: Not on file   Drug use: Not on file   Sexual activity: Not on file  Other Topics Concern   Not on file  Social History Narrative   Not on file   Social Determinants of Health   Financial Resource Strain: Not on file  Food Insecurity: Not on file  Transportation Needs: Not on file  Physical Activity: Not on file  Stress: Not on file  Social Connections: Not on file   Family History  Problem Relation Age of Onset   Liver cancer Mother    COPD Father    No Known Allergies Prior to Admission medications    Medication Sig Start Date End Date Taking? Authorizing Provider  cephALEXin (KEFLEX) 500 MG capsule Take 1 capsule (500 mg total) by mouth 2 (two) times daily. 1 Tab twice per day for the next 10 days. Start tonight 06/15/21   Drema Halon, MD  metoprolol succinate (TOPROL-XL) 25 MG 24 hr tablet TAKE 1 TABLET DAILY WITH FOOD 09/05/20   Runell Gess, MD  omeprazole (PRILOSEC) 20 MG capsule Take 1 capsule by mouth daily. 05/05/18   [provider]  quinapril (ACCUPRIL) 20 MG tablet Take 20 mg by mouth at bedtime.    [provider]     Physical Exam: On nasal exam in the office today he has minimal crusting within the right middle meatus but the maxillary ostia is widely patent and the nasal passageway is clear.   Assessment: S/p bilateral turbinate reductions and right FESS  Plan: Recommended using saline irrigation for another week or 2 and then as needed. Reviewed with him that I will be retiring in 2 weeks and that he will have to follow-up with another ENT group if he has any problems in the future.  Presently he is doing well.   Narda Bonds, MD

## 2021-07-25 ENCOUNTER — Other Ambulatory Visit: Payer: Self-pay

## 2021-07-25 ENCOUNTER — Ambulatory Visit (INDEPENDENT_AMBULATORY_CARE_PROVIDER_SITE_OTHER): Payer: Medicare PPO | Admitting: Otolaryngology

## 2021-07-25 DIAGNOSIS — Z4889 Encounter for other specified surgical aftercare: Secondary | ICD-10-CM

## 2021-07-25 NOTE — Progress Notes (Signed)
HPI: Edward Vaughn is a 75 y.o. male who presents 5 weeks s/p right FESS and turbinate reductions for chronic right-sided sinus disease.  He is doing well with no drainage and breathing much better..   Past Medical History:  Diagnosis Date   Chest pain    GERD (gastroesophageal reflux disease)    Hypertension    Past Surgical History:  Procedure Laterality Date   ETHMOIDECTOMY Right 06/15/2021   Procedure: RIGHT ANTERIOR ETHMOIDECTOMY;  Surgeon: Drema Halon, MD;  Location: [facility];  Service: ENT;  Laterality: Right;   KNEE SURGERY     NASAL SINUS SURGERY Right 06/15/2021   Procedure: FUNCTIONAL ENDOSCOPIC SINUS SURGERY AND RIGHT MAXILLARY OSTEO ENLARGEMENT;  Surgeon: Drema Halon, MD;  Location: [facility];  Service: ENT;  Laterality: Right;   TURBINATE REDUCTION Bilateral 06/15/2021   Procedure: BILATERAL INFERIOR TURBINATE REDUCTION;  Surgeon: Drema Halon, MD;  Location: [facility];  Service: ENT;  Laterality: Bilateral;   VEIN SURGERY     Social History   Socioeconomic History   Marital status: Married    Spouse name: Not on file   Number of children: Not on file   Years of education: Not on file   Highest education level: Not on file  Occupational History   Not on file  Tobacco Use   Smoking status: Former    Types: Cigarettes   Smokeless tobacco: Never  Substance and Sexual Activity   Alcohol use: Not on file   Drug use: Not on file   Sexual activity: Not on file  Other Topics Concern   Not on file  Social History Narrative   Not on file   Social Determinants of Health   Financial Resource Strain: Not on file  Food Insecurity: Not on file  Transportation Needs: Not on file  Physical Activity: Not on file  Stress: Not on file  Social Connections: Not on file   Family History  Problem Relation Age of Onset   Liver cancer Mother    COPD Father    No Known Allergies Prior to  Admission medications   Medication Sig Start Date End Date Taking? Authorizing Provider  cephALEXin (KEFLEX) 500 MG capsule Take 1 capsule (500 mg total) by mouth 2 (two) times daily. 1 Tab twice per day for the next 10 days. Start tonight 06/15/21   Drema Halon, MD  metoprolol succinate (TOPROL-XL) 25 MG 24 hr tablet TAKE 1 TABLET DAILY WITH FOOD 09/05/20   Runell Gess, MD  omeprazole (PRILOSEC) 20 MG capsule Take 1 capsule by mouth daily. 05/05/18   [provider]  quinapril (ACCUPRIL) 20 MG tablet Take 20 mg by mouth at bedtime.    [provider]     Physical Exam: He has minimal crusting along the inferior turbinates and the middle meatus is widely patent on the right side.  No signs of infection.   Assessment: S/p right FESS and turbinate reduction.  Plan: Recommended use of a saline rinse for another week and as needed. Reviewed with him that I will be retiring and if he has any problems with his nose or sinuses in the future will need to follow-up with one of the other ENT groups.   Narda Bonds, MD

## 2021-08-08 ENCOUNTER — Ambulatory Visit: Payer: Medicare PPO | Admitting: Cardiovascular Disease

## 2021-09-14 ENCOUNTER — Other Ambulatory Visit: Payer: Self-pay | Admitting: Cardiovascular Disease

## 2022-05-10 IMAGING — CT CT MAXILLOFACIAL W/O CM
3 series · 15 of 47 positions shown, 18 images · non-contrast
Comparison: None.

CLINICAL DATA: Headache.  Right-sided nasal polyp.

EXAM:
CT MAXILLOFACIAL WITHOUT CONTRAST
TECHNIQUE: Multidetector CT images of the paranasal sinuses were obtained using
the standard protocol without intravenous contrast.

[Series 5: soft tissue · axial · 0.39mm/px · z∈[-140,-18]mm · 9 of 142 slices shown, 12 images]
[im 10/142  brain]
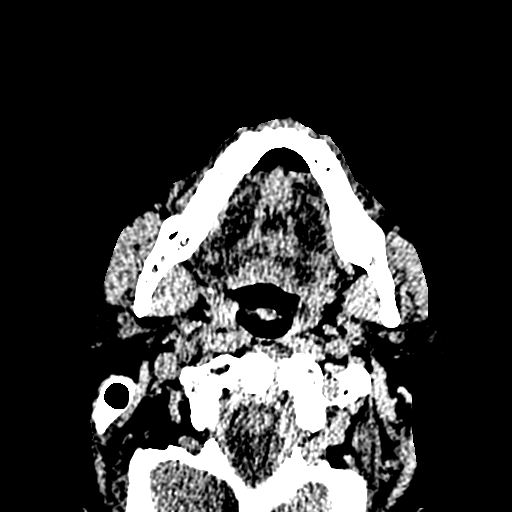
[im 10/142  bone]
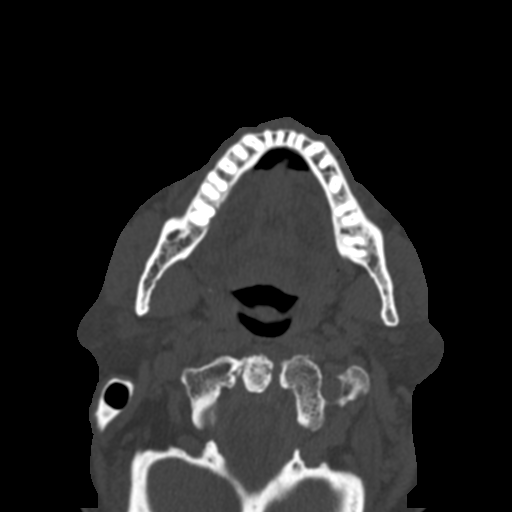
[im 25/142  bone]
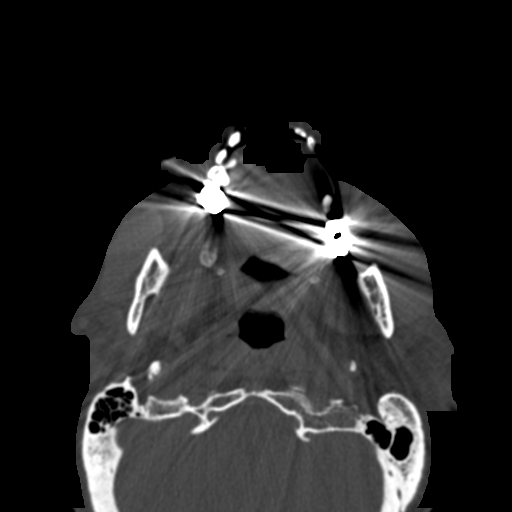
[im 39/142  bone]
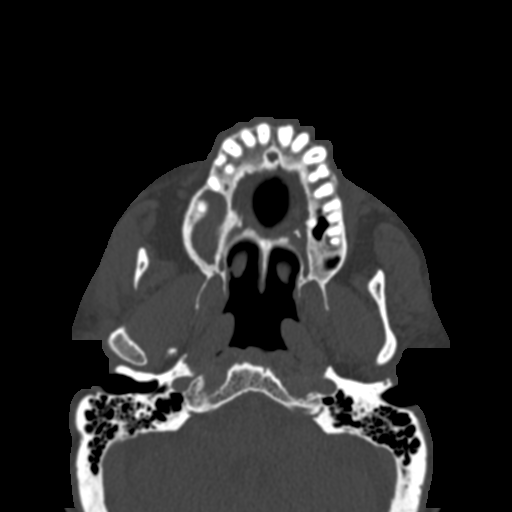
[im 54/142  bone]
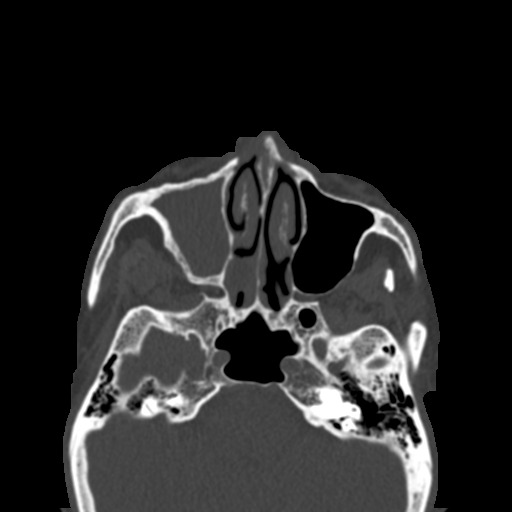
[im 73/142  brain]
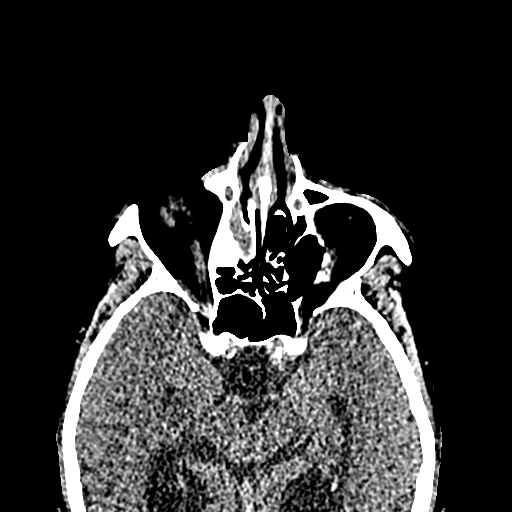
[im 73/142  bone]
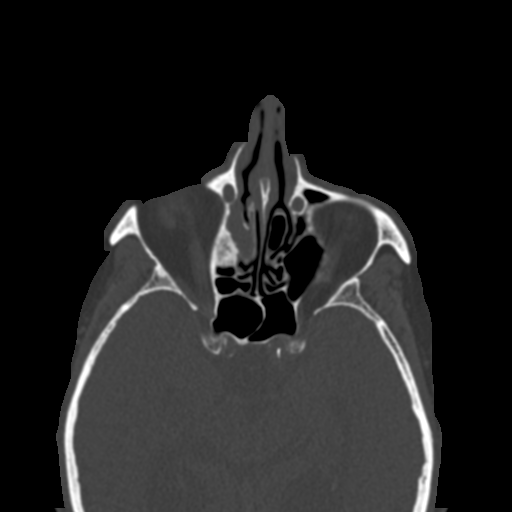
[im 88/142  bone]
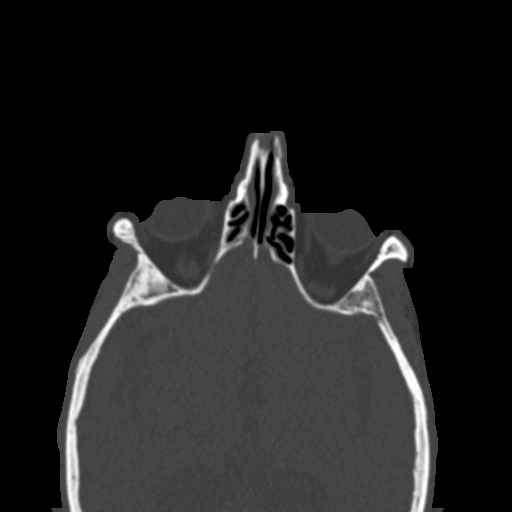
[im 103/142  bone]
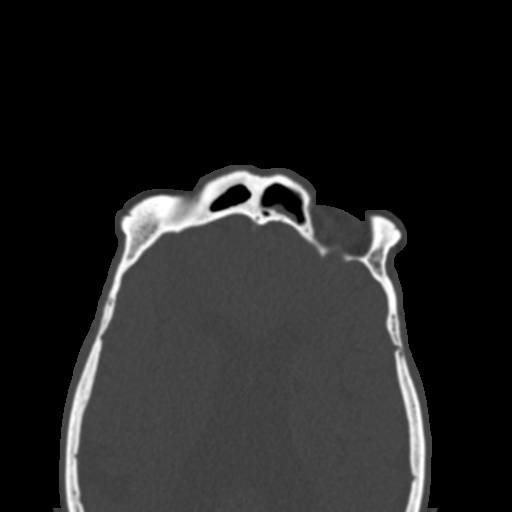
[im 117/142  bone]
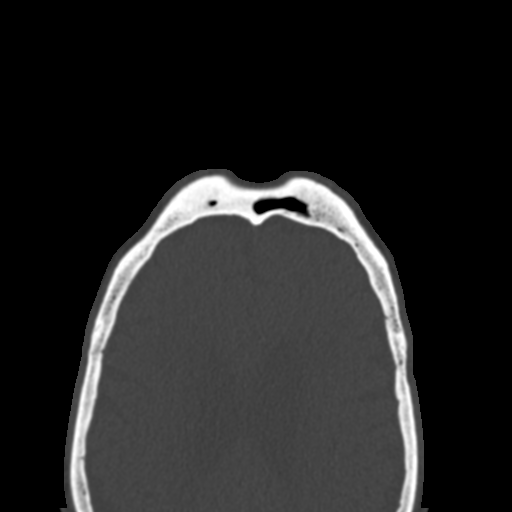
[im 132/142  brain]
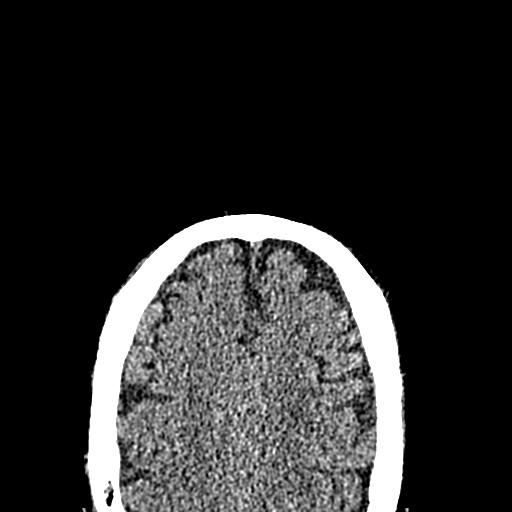
[im 132/142  bone]
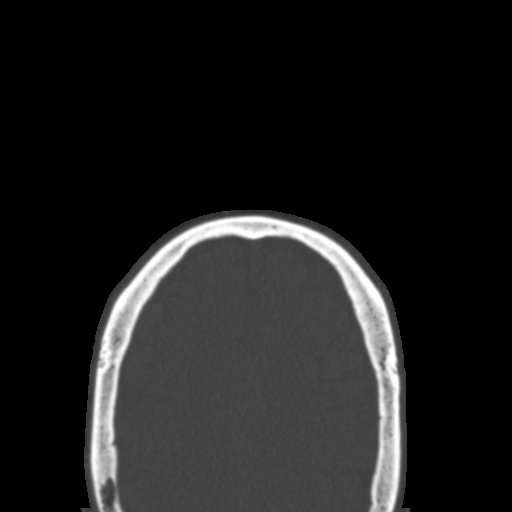

[Series 7: sag bone · sagittal · 0.30mm/px · 3 of 98 slices shown]
[im 33/98  bone]
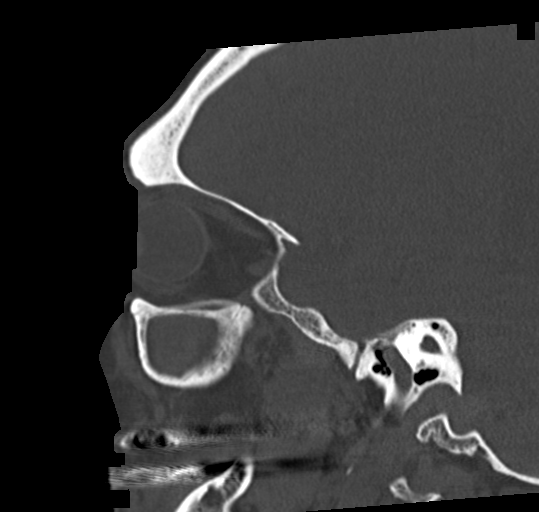
[im 49/98  bone]
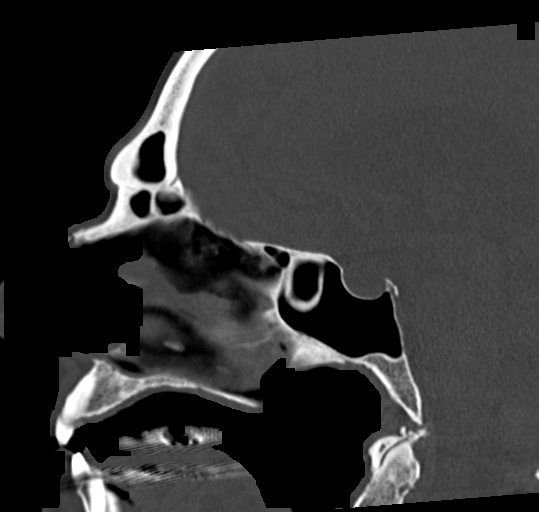
[im 65/98  bone]
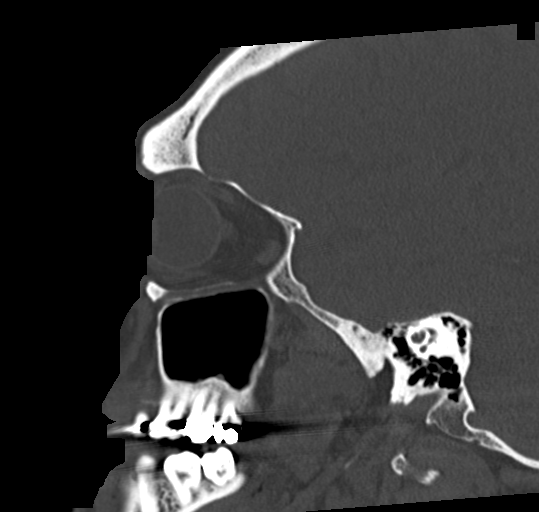

[Series 8: cor soft · coronal · 0.30mm/px · 3 of 82 slices shown]
[im 28/82  bone]
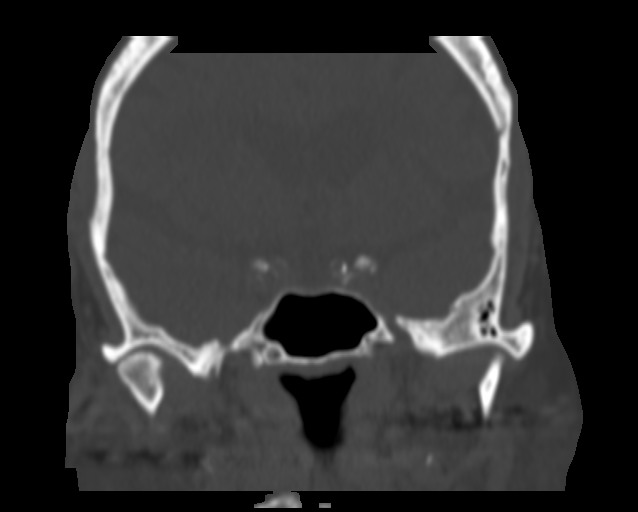
[im 37/82  bone]
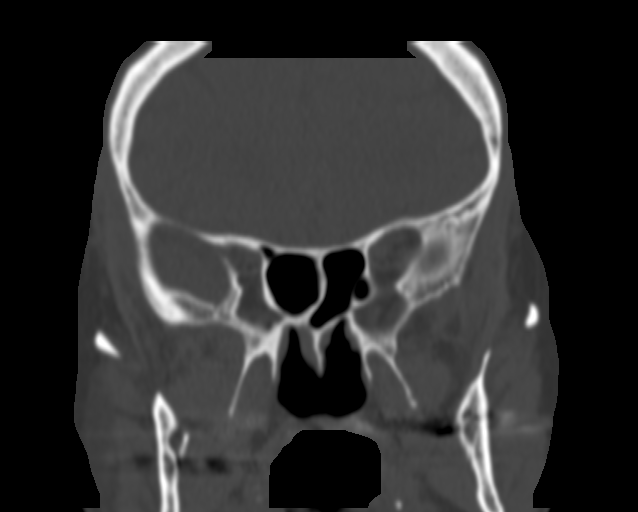
[im 46/82  bone]
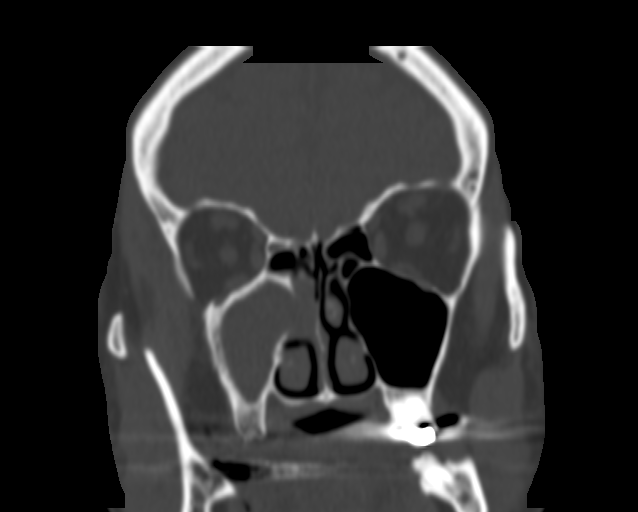

[15 of 47 positions shown; findings below may reference images not displayed]

FINDINGS: Paranasal sinuses:

Frontal: Right frontal sinus clear. Mild mucosal edema left frontal
sinus

Ethmoid: Moderate mucosal edema right ethmoid sinus. Left ethmoid
sinus clear

Maxillary: Chronic obstruction right maxillary sinus which is fully
opacified. There is bony thickening of the right maxillary sinus.
There is widening of the ostiomeatal complex on the right.

Left maxillary sinus clear

Sphenoid: Normally aerated. Patent sphenoethmoidal recesses.

Mastoid: Clear bilaterally

Right ostiomeatal unit: Occluded. Likely polyp extending into the
posterior nasopharynx on the right.

Left ostiomeatal unit: Patent.

Nasal passages: Increased soft tissue in the right nasal passage
right likely polyp obstructing the right maxillary sinus. Polyp
extends into the posterior nasopharynx on the right. Slight septal
deviation to the right.

Anatomy: Keros type 1 olfactory recess

Concha bullosa left middle turbinate

Limited intracranial imaging demonstrates no acute intracranial
abnormality

Negative orbit bilaterally.
IMPRESSION: Chronic obstruction right maxillary sinus with bony thickening.
There is obstruction of the right ostiomeatal complex likely due to
a polyp extending into the posterior nasopharynx on the right.

Mild mucosal edema left frontal sinus and moderate mucosal edema
right ethmoid sinus.

## 2022-06-30 DEATH — deceased
